# Patient Record
Sex: Male | Born: 1945 | Race: Black or African American | Hispanic: No | Marital: Married | Smoking: Former smoker
Health system: Southern US, Community
[De-identification: ages and names within clinical notes are randomized; demographics above are authoritative.]

## PROBLEM LIST (undated history)

## (undated) DIAGNOSIS — M1990 Unspecified osteoarthritis, unspecified site: Secondary | ICD-10-CM

## (undated) DIAGNOSIS — K648 Other hemorrhoids: Secondary | ICD-10-CM

## (undated) DIAGNOSIS — E7800 Pure hypercholesterolemia, unspecified: Secondary | ICD-10-CM

## (undated) DIAGNOSIS — I2510 Atherosclerotic heart disease of native coronary artery without angina pectoris: Secondary | ICD-10-CM

## (undated) DIAGNOSIS — R7303 Prediabetes: Secondary | ICD-10-CM

## (undated) DIAGNOSIS — T7840XA Allergy, unspecified, initial encounter: Secondary | ICD-10-CM

## (undated) DIAGNOSIS — I10 Essential (primary) hypertension: Secondary | ICD-10-CM

## (undated) HISTORY — PX: HERNIA REPAIR: SHX51

## (undated) HISTORY — PX: OTHER SURGICAL HISTORY: SHX169

## (undated) HISTORY — PX: POLYPECTOMY: SHX149

## (undated) HISTORY — DX: Unspecified osteoarthritis, unspecified site: M19.90

## (undated) HISTORY — DX: Other hemorrhoids: K64.8

## (undated) HISTORY — DX: Pure hypercholesterolemia, unspecified: E78.00

## (undated) HISTORY — DX: Atherosclerotic heart disease of native coronary artery without angina pectoris: I25.10

## (undated) HISTORY — DX: Essential (primary) hypertension: I10

## (undated) HISTORY — DX: Allergy, unspecified, initial encounter: T78.40XA

## (undated) HISTORY — DX: Prediabetes: R73.03

## (undated) HISTORY — PX: COLONOSCOPY: SHX174

---

## 2001-03-05 ENCOUNTER — Encounter (INDEPENDENT_AMBULATORY_CARE_PROVIDER_SITE_OTHER): Payer: Self-pay | Admitting: Specialist

## 2001-03-05 ENCOUNTER — Ambulatory Visit (HOSPITAL_COMMUNITY): Admission: RE | Admit: 2001-03-05 | Discharge: 2001-03-05 | Payer: Self-pay | Admitting: Internal Medicine

## 2004-07-22 ENCOUNTER — Ambulatory Visit (HOSPITAL_COMMUNITY): Admission: RE | Admit: 2004-07-22 | Discharge: 2004-07-22 | Payer: Self-pay | Admitting: Family Medicine

## 2007-02-12 ENCOUNTER — Ambulatory Visit: Payer: Self-pay | Admitting: Internal Medicine

## 2007-02-26 ENCOUNTER — Ambulatory Visit: Payer: Self-pay | Admitting: Internal Medicine

## 2007-04-14 ENCOUNTER — Encounter: Admission: RE | Admit: 2007-04-14 | Discharge: 2007-04-14 | Payer: Self-pay | Admitting: Orthopedic Surgery

## 2014-08-02 ENCOUNTER — Encounter: Payer: Self-pay | Admitting: *Deleted

## 2014-11-26 ENCOUNTER — Ambulatory Visit (INDEPENDENT_AMBULATORY_CARE_PROVIDER_SITE_OTHER): Payer: Medicare Other

## 2014-11-26 ENCOUNTER — Ambulatory Visit (INDEPENDENT_AMBULATORY_CARE_PROVIDER_SITE_OTHER): Payer: Medicare Other | Admitting: Family Medicine

## 2014-11-26 VITALS — BP 150/80 | HR 77 | Temp 98.1°F | Ht 72.0 in | Wt 201.0 lb

## 2014-11-26 DIAGNOSIS — M25551 Pain in right hip: Secondary | ICD-10-CM

## 2014-11-26 DIAGNOSIS — M1611 Unilateral primary osteoarthritis, right hip: Secondary | ICD-10-CM

## 2014-11-26 LAB — GLUCOSE, POCT (MANUAL RESULT ENTRY): POC Glucose: 120 mg/dl — AB (ref 70–99)

## 2014-11-26 MED ORDER — MELOXICAM 7.5 MG PO TABS: 7.5000 mg | ORAL_TABLET | Freq: Every day | ORAL | Status: DC

## 2014-11-26 MED ORDER — TRAMADOL HCL 50 MG PO TABS: 50.0000 mg | ORAL_TABLET | Freq: Four times a day (QID) | ORAL | Status: DC | PRN

## 2014-11-26 NOTE — Patient Instructions (Signed)
mobic once per day (Keep a record of your blood pressures outside of the office and if remaining over 150/90 - stop the meloxicam). tramadol if needed for breakthrough pain.  If not improving in next week to 10 days, let me know and I can refer you to an orthopaedist. Return to the clinic or go to the nearest emergency room if any of your symptoms worsen or new symptoms occur.  Hip Pain Your hip is the joint between your upper legs and your lower pelvis. The bones, cartilage, tendons, and muscles of your hip joint perform a lot of work each day supporting your body weight and allowing you to move around. Hip pain can range from a minor ache to severe pain in one or both of your hips. Pain may be felt on the inside of the hip joint near the groin, or the outside near the buttocks and upper thigh. You may have swelling or stiffness as well.  HOME CARE INSTRUCTIONS   Take medicines only as directed by your health care provider.  Apply ice to the injured area:  Put ice in a plastic bag.  Place a towel between your skin and the bag.  Leave the ice on for 15-20 minutes at a time, 3-4 times a day.  Keep your leg raised (elevated) when possible to lessen swelling.  Avoid activities that cause pain.  Follow specific exercises as directed by your health care provider.  Sleep with a pillow between your legs on your most comfortable side.  Record how often you have hip pain, the location of the pain, and what it feels like. SEEK MEDICAL CARE IF:   You are unable to put weight on your leg.  Your hip is red or swollen or very tender to touch.  Your pain or swelling continues or worsens after 1 week.  You have increasing difficulty walking.  You have a fever. SEEK IMMEDIATE MEDICAL CARE IF:   You have fallen.  You have a sudden increase in pain and swelling in your hip. MAKE SURE YOU:   Understand these instructions.  Will watch your condition.  Will get help right away if you are  not doing well or get worse. Document Released: 02/26/2010 Document Revised: 01/23/2014 Document Reviewed: 05/05/2013 [facility] Patient Information 2015 [place], [place]. This information is not intended to replace advice given to you by your health care provider. Make sure you discuss any questions you have with your health care provider.

## 2014-11-26 NOTE — Progress Notes (Addendum)
Subjective:    Patient ID: Chris Moore, male    DOB: May 19, 1946, 69 y.o.   MRN: [id number] This chart was scribed for Chris Agreste, MD by Martinique Peace, ED Scribe. The patient was seen in [facility]. The patient's care was started at 11:10 AM.  Hip Pain    HPI Comments: [name] is a 69 y.o. male who presents to the [facility] complaining of right hip pain onset 1 month ago that occurred while pt was walking and and began experiencing a sudden sharp pain. He explains that he plays golf frequently which could be responsible for current symptoms. He states he took a break from playing golf due to pain for 1 week to let hip rest. He reports pain had completely resolved after week of rest and started back to playing golf before aggravating hip again. Pt states pain is exacerbated with movement and intermittently radiates down into thigh and groin area. He further complains of new onset of weakness in right leg but denies any occurrences of leg giving out on him. He notes he has tried taking Advil and applying ice to affected area without relief. Pt denies any history of recent surgeries or falls. No complaints of rash, saddle numbness, or loss of urinary or bowel functions.     There are no active problems to display for this patient.   Past Medical History  Diagnosis Date  . Hypertension   . Hypercholesteremia   . Prediabetes   . Internal hemorrhoids   . Allergy   . Arthritis    Past Surgical History  Procedure Laterality Date  . Left knee arthroscopic surgery     No Known Allergies Prior to Admission medications   Medication Sig Start Date End Date Taking? Authorizing Provider  amLODipine (NORVASC) 5 MG tablet Take 5 mg by mouth daily.   Yes Historical Provider, MD  aspirin 81 MG tablet Take 81 mg by mouth daily.   Yes Historical Provider, MD  atorvastatin (LIPITOR) 40 MG tablet Take 40 mg by mouth daily.   Yes Historical Provider, MD  fluticasone (FLONASE) 50 MCG/ACT  nasal spray Place 1 spray into both nostrils daily.   Yes Historical Provider, MD   History   Social History  . Marital Status: Married    Spouse Name: N/A  . Number of Children: N/A  . Years of Education: N/A   Occupational History  . Not on file.   Social History Main Topics  . Smoking status: Never Smoker   . Smokeless tobacco: Not on file  . Alcohol Use: No  . Drug Use: No  . Sexual Activity: Not on file   Other Topics Concern  . Not on file   Social History Narrative      Review of Systems  Gastrointestinal: Negative for diarrhea and constipation.  Genitourinary: Negative for dysuria, urgency, frequency, decreased urine volume and difficulty urinating.  Musculoskeletal:       Right hip pain.   Skin: Negative for rash.  Neurological: Positive for weakness.       Objective:   Physical Exam  Constitutional: He is oriented to person, place, and time. He appears well-developed and well-nourished. No distress.  HENT:  Head: Normocephalic and atraumatic.  Eyes: Conjunctivae and EOM are normal.  Neck: Neck supple. No tracheal deviation present.  Cardiovascular: Normal rate.   Pulmonary/Chest: Effort normal. No respiratory distress.  Musculoskeletal: Normal range of motion.  L Spine- Full ROM. Able to heel and toe walk without  weakness. No sciatic tenderness.  R Hip- trochanteric bursa is non-tender. No palpable tenderness. Full flexion and full extension. Minimal discomfort with flexion of hip. Negative fadir, negative Corky Sox. Negative SLR.   Neurological: He is alert and oriented to person, place, and time. He displays no Babinski's sign on the right side. He displays no Babinski's sign on the left side.  Reflex Scores:      Patellar reflexes are 2+ on the right side and 2+ on the left side.      Achilles reflexes are 2+ on the right side and 2+ on the left side. Skin: Skin is warm and dry.  Psychiatric: He has a normal mood and affect. His behavior is normal.    Nursing note and vitals reviewed.    Filed Vitals:   11/26/14 1053  BP: 150/80  Pulse: 77  Temp: 98.1 F (36.7 C)  TempSrc: Oral  Height: 6' (1.829 m)  Weight: 201 lb (91.173 kg)  SpO2: 98%   [facility] reading (PRIMARY) by  Dr. Carlota Raspberry: R hip:  Degenerative changes, no apparent fx.   Results for orders placed or performed in visit on 11/26/14  POCT glucose (manual entry)  Result Value Ref Range   POC Glucose 120 (A) 70 - 99 mg/dl       Assessment & Plan:   [name] is a 69 y.o. male Right hip pain - Plan: DG HIP UNILAT WITH PELVIS 2-3 VIEWS RIGHT, POCT glucose (manual entry), meloxicam (MOBIC) 7.5 MG tablet, traMADol (ULTRAM) 50 MG tablet  Osteoarthritis of right hip, unspecified osteoarthritis type - Plan: meloxicam (MOBIC) 7.5 MG tablet, traMADol (ULTRAM) 50 MG tablet  Suspected OA of hip, but ddx includes sciatica. Start meloxicam (watch BP, sed and cardiac risks of this and NSAIDS discussed, so short term use ideally), tramadol if needed - SED. Recheck in next week to 10 days if not improving, sooner if worse.   Prediabetes - follow up with PCP. Not fasting this am. Not using prednisone yet, but consider glucose monitoring if this is used.   Meds ordered this encounter  Medications  . meloxicam (MOBIC) 7.5 MG tablet    Sig: Take 1 tablet (7.5 mg total) by mouth daily.    Dispense:  30 tablet    Refill:  0  . traMADol (ULTRAM) 50 MG tablet    Sig: Take 1 tablet (50 mg total) by mouth every 6 (six) hours as needed.    Dispense:  20 tablet    Refill:  0   Patient Instructions  mobic once per day (Keep a record of your blood pressures outside of the office and if remaining over 150/90 - stop the meloxicam). tramadol if needed for breakthrough pain.  If not improving in next week to 10 days, let me know and I can refer you to an orthopaedist. Return to the clinic or go to the nearest emergency room if any of your symptoms worsen or new symptoms occur.  Hip  Pain Your hip is the joint between your upper legs and your lower pelvis. The bones, cartilage, tendons, and muscles of your hip joint perform a lot of work each day supporting your body weight and allowing you to move around. Hip pain can range from a minor ache to severe pain in one or both of your hips. Pain may be felt on the inside of the hip joint near the groin, or the outside near the buttocks and upper thigh. You may have swelling or stiffness as  well.  HOME CARE INSTRUCTIONS   Take medicines only as directed by your health care provider.  Apply ice to the injured area:  Put ice in a plastic bag.  Place a towel between your skin and the bag.  Leave the ice on for 15-20 minutes at a time, 3-4 times a day.  Keep your leg raised (elevated) when possible to lessen swelling.  Avoid activities that cause pain.  Follow specific exercises as directed by your health care provider.  Sleep with a pillow between your legs on your most comfortable side.  Record how often you have hip pain, the location of the pain, and what it feels like. SEEK MEDICAL CARE IF:   You are unable to put weight on your leg.  Your hip is red or swollen or very tender to touch.  Your pain or swelling continues or worsens after 1 week.  You have increasing difficulty walking.  You have a fever. SEEK IMMEDIATE MEDICAL CARE IF:   You have fallen.  You have a sudden increase in pain and swelling in your hip. MAKE SURE YOU:   Understand these instructions.  Will watch your condition.  Will get help right away if you are not doing well or get worse. Document Released: 02/26/2010 Document Revised: 01/23/2014 Document Reviewed: 05/05/2013 [facility] Patient Information 2015 [place], [place]. This information is not intended to replace advice given to you by your health care provider. Make sure you discuss any questions you have with your health care provider.       I personally performed the  services described in this documentation, which was scribed in my presence. The recorded information has been reviewed and considered, and addended by me as needed.

## 2014-11-30 ENCOUNTER — Other Ambulatory Visit: Payer: Self-pay | Admitting: Family Medicine

## 2014-11-30 DIAGNOSIS — Z136 Encounter for screening for cardiovascular disorders: Secondary | ICD-10-CM

## 2014-12-26 ENCOUNTER — Ambulatory Visit
Admission: RE | Admit: 2014-12-26 | Discharge: 2014-12-26 | Disposition: A | Discharge status: Home / Self Care | Payer: Medicare Other | Source: Ambulatory Visit | Attending: Family Medicine | Admitting: Family Medicine

## 2014-12-26 DIAGNOSIS — Z136 Encounter for screening for cardiovascular disorders: Secondary | ICD-10-CM

## 2015-06-29 ENCOUNTER — Other Ambulatory Visit: Payer: Self-pay | Admitting: Physician Assistant

## 2015-06-29 DIAGNOSIS — M25551 Pain in right hip: Secondary | ICD-10-CM

## 2015-07-16 ENCOUNTER — Ambulatory Visit
Admission: RE | Admit: 2015-07-16 | Discharge: 2015-07-16 | Disposition: A | Discharge status: Home / Self Care | Payer: Medicare Other | Source: Ambulatory Visit | Attending: Physician Assistant | Admitting: Physician Assistant

## 2015-07-16 DIAGNOSIS — M25551 Pain in right hip: Secondary | ICD-10-CM

## 2016-04-10 ENCOUNTER — Ambulatory Visit (INDEPENDENT_AMBULATORY_CARE_PROVIDER_SITE_OTHER): Payer: Medicare Other | Admitting: Physician Assistant

## 2016-04-10 DIAGNOSIS — R7303 Prediabetes: Secondary | ICD-10-CM | POA: Insufficient documentation

## 2016-04-10 DIAGNOSIS — L03012 Cellulitis of left finger: Secondary | ICD-10-CM

## 2016-04-10 DIAGNOSIS — J309 Allergic rhinitis, unspecified: Secondary | ICD-10-CM | POA: Insufficient documentation

## 2016-04-10 DIAGNOSIS — E7800 Pure hypercholesterolemia, unspecified: Secondary | ICD-10-CM | POA: Insufficient documentation

## 2016-04-10 DIAGNOSIS — M1990 Unspecified osteoarthritis, unspecified site: Secondary | ICD-10-CM | POA: Insufficient documentation

## 2016-04-10 DIAGNOSIS — I10 Essential (primary) hypertension: Secondary | ICD-10-CM | POA: Insufficient documentation

## 2016-04-10 MED ORDER — DOXYCYCLINE HYCLATE 100 MG PO TABS: 100.0000 mg | ORAL_TABLET | Freq: Two times a day (BID) | ORAL | Status: DC

## 2016-04-10 NOTE — Patient Instructions (Addendum)
  Warm compresses Ok to use antibiotic ointment on the open area   IF you received an x-ray today, you will receive an invoice from [facility]. Please contact [facility] at [phone] with questions or concerns regarding your invoice.   IF you received labwork today, you will receive an invoice from [employer]. Please contact [facility] at [phone] with questions or concerns regarding your invoice.   Our billing staff will not be able to assist you with questions regarding bills from these companies.  You will be contacted with the lab results as soon as they are available. The fastest way to get your results is to activate your My Chart account. Instructions are located on the last page of this paperwork. If you have not heard from [place] regarding the results in 2 weeks, please contact this office.

## 2016-04-10 NOTE — Progress Notes (Signed)
   Chris Moore  MRN: [id number] DOB: 1945/10/01  Subjective:  Pt presents to clinic left great lateral ltoe pain for about a week - he thinks it is infected.  He has been getting out some pus. He has been keeping it clean with H2O2 but no warm compresses.  He has had ingrown toenails in the past and earlier this week he pulled out a piece of sharp nail on that side of his nail.  He otherwise has had no trauma to the area.  Review of Systems  Constitutional: Negative for fever and chills.  Skin: Positive for wound.    Patient Active Problem List   Diagnosis Date Noted  . Benign essential HTN 04/10/2016  . Hypercholesteremia 04/10/2016  . Prediabetes 04/10/2016  . Arthritis 04/10/2016  . Allergic rhinitis 04/10/2016    Current Outpatient Prescriptions on File Prior to Visit  Medication Sig Dispense Refill  . aspirin 81 MG tablet Take 81 mg by mouth daily.    Marland Kitchen atorvastatin (LIPITOR) 40 MG tablet Take 40 mg by mouth daily.    . fluticasone (FLONASE) 50 MCG/ACT nasal spray Place 1 spray into both nostrils daily.    . meloxicam (MOBIC) 7.5 MG tablet Take 1 tablet (7.5 mg total) by mouth daily. 30 tablet 0  . traMADol (ULTRAM) 50 MG tablet Take 1 tablet (50 mg total) by mouth every 6 (six) hours as needed. 20 tablet 0   No current facility-administered medications on file prior to visit.    No Known Allergies  Objective:  BP 124/80 mmHg  Pulse 83  Temp(Src) 98.1 F (36.7 C) (Oral)  Resp 18  Ht 6' (1.829 m)  Wt 197 lb (89.359 kg)  BMI 26.71 kg/m2  SpO2 99%  Physical Exam  Constitutional: He is oriented to person, place, and time and well-developed, well-nourished, and in no distress.  HENT:  Head: Normocephalic and atraumatic.  Right Ear: External ear normal.  Left Ear: External ear normal.  Eyes: Conjunctivae are normal.  Neck: Normal range of motion.  Pulmonary/Chest: Effort normal.  Neurological: He is alert and oriented to person, place, and time. Gait  normal.  Skin: Skin is warm and dry.  Left great toe - medial edge near the base of the nail fold is slightly erythematous with a small area of granulation tissue - the medial edge of the nail is completely visible  Psychiatric: Mood, memory, affect and judgment normal.   Procedure: Consent obtained - silver nitrate used on the granulation tissue - pt tolerated well Assessment and Plan :  Paronychia, left - Plan: doxycycline (VIBRA-TABS) 100 MG tablet   Pt to use warm compresses - I do not suspect an ingrown nail because the edge of the nail is visible - we will treat with abx as there is redness and some pain - he will f/u if there is any problems  Windell Hummingbird PA-C  [facility] 04/10/2016 9:12 AM

## 2016-10-07 ENCOUNTER — Other Ambulatory Visit (INDEPENDENT_AMBULATORY_CARE_PROVIDER_SITE_OTHER): Payer: Self-pay | Admitting: Orthopaedic Surgery

## 2016-12-18 ENCOUNTER — Other Ambulatory Visit (INDEPENDENT_AMBULATORY_CARE_PROVIDER_SITE_OTHER): Payer: Self-pay | Admitting: Orthopaedic Surgery

## 2016-12-29 ENCOUNTER — Emergency Department (HOSPITAL_COMMUNITY)
Admission: EM | Admit: 2016-12-29 | Discharge: 2016-12-29 | Disposition: A | Discharge status: Home / Self Care | Payer: Medicare Other | Attending: Emergency Medicine | Admitting: Emergency Medicine

## 2016-12-29 ENCOUNTER — Ambulatory Visit (INDEPENDENT_AMBULATORY_CARE_PROVIDER_SITE_OTHER): Payer: Medicare Other | Admitting: Family Medicine

## 2016-12-29 ENCOUNTER — Ambulatory Visit (INDEPENDENT_AMBULATORY_CARE_PROVIDER_SITE_OTHER): Payer: Medicare Other

## 2016-12-29 ENCOUNTER — Encounter (HOSPITAL_COMMUNITY): Payer: Self-pay

## 2016-12-29 VITALS — BP 148/83 | HR 104 | Temp 98.8°F | Resp 16 | Ht 72.0 in | Wt 196.0 lb

## 2016-12-29 DIAGNOSIS — R059 Cough, unspecified: Secondary | ICD-10-CM | POA: Insufficient documentation

## 2016-12-29 DIAGNOSIS — R791 Abnormal coagulation profile: Secondary | ICD-10-CM | POA: Diagnosis not present

## 2016-12-29 DIAGNOSIS — R05 Cough: Secondary | ICD-10-CM

## 2016-12-29 DIAGNOSIS — Z79899 Other long term (current) drug therapy: Secondary | ICD-10-CM | POA: Insufficient documentation

## 2016-12-29 DIAGNOSIS — I10 Essential (primary) hypertension: Secondary | ICD-10-CM | POA: Diagnosis not present

## 2016-12-29 DIAGNOSIS — J40 Bronchitis, not specified as acute or chronic: Secondary | ICD-10-CM | POA: Insufficient documentation

## 2016-12-29 DIAGNOSIS — Z7982 Long term (current) use of aspirin: Secondary | ICD-10-CM | POA: Diagnosis not present

## 2016-12-29 DIAGNOSIS — R000 Tachycardia, unspecified: Secondary | ICD-10-CM

## 2016-12-29 LAB — CBC
HCT: 41.2 % (ref 39.0–52.0)
Hemoglobin: 13.5 g/dL (ref 13.0–17.0)
MCH: 29 pg (ref 26.0–34.0)
MCHC: 32.8 g/dL (ref 30.0–36.0)
MCV: 88.6 fL (ref 78.0–100.0)
Platelets: 254 10*3/uL (ref 150–400)
RBC: 4.65 MIL/uL (ref 4.22–5.81)
RDW: 14 % (ref 11.5–15.5)
WBC: 15 10*3/uL — ABNORMAL HIGH (ref 4.0–10.5)

## 2016-12-29 LAB — POCT CBC
Granulocyte percent: 83 %G — AB (ref 37–80)
HCT, POC: 40 % — AB (ref 43.5–53.7)
Hemoglobin: 13.8 g/dL — AB (ref 14.1–18.1)
Lymph, poc: 2 (ref 0.6–3.4)
MCH, POC: 31 pg (ref 27–31.2)
MCHC: 34.5 g/dL (ref 31.8–35.4)
MCV: 89.8 fL (ref 80–97)
MID (cbc): 0.7 (ref 0–0.9)
MPV: 7.4 fL (ref 0–99.8)
POC Granulocyte: 13.6 — AB (ref 2–6.9)
POC LYMPH PERCENT: 12.5 %L (ref 10–50)
POC MID %: 4.5 %M (ref 0–12)
Platelet Count, POC: 239 10*3/uL (ref 142–424)
RBC: 4.45 M/uL — AB (ref 4.69–6.13)
RDW, POC: 14.5 %
WBC: 16.4 10*3/uL — AB (ref 4.6–10.2)

## 2016-12-29 LAB — BASIC METABOLIC PANEL
Anion gap: 9 (ref 5–15)
BUN: 14 mg/dL (ref 6–20)
CO2: 28 mmol/L (ref 22–32)
Calcium: 8.8 mg/dL — ABNORMAL LOW (ref 8.9–10.3)
Chloride: 102 mmol/L (ref 101–111)
Creatinine, Ser: 1.15 mg/dL (ref 0.61–1.24)
GFR calc Af Amer: >60 mL/min (ref >60)
GFR calc non Af Amer: >60 mL/min (ref >60)
Glucose, Bld: 120 mg/dL — ABNORMAL HIGH (ref 65–99)
Potassium: 3.9 mmol/L (ref 3.5–5.1)
Sodium: 139 mmol/L (ref 135–145)

## 2016-12-29 LAB — I-STAT TROPONIN, ED: Troponin i, poc: 0.01 ng/mL (ref 0.00–0.08)

## 2016-12-29 LAB — PROTIME-INR
INR: 1.03
Prothrombin Time: 13.6 seconds (ref 11.4–15.2)

## 2016-12-29 LAB — INFLUENZA PANEL BY PCR (TYPE A & B)
Influenza A By PCR: NEGATIVE
Influenza B By PCR: NEGATIVE

## 2016-12-29 MED ORDER — BENZONATATE 100 MG PO CAPS: 100.0000 mg | ORAL_CAPSULE | Freq: Every evening | ORAL | 0 refills | Status: AC | PRN

## 2016-12-29 MED ORDER — PREDNISONE 10 MG (21) PO TBPK: ORAL_TABLET | Freq: Every day | ORAL | 0 refills | Status: DC

## 2016-12-29 MED ORDER — FEXOFENADINE HCL 60 MG PO TABS: 60.0000 mg | ORAL_TABLET | Freq: Two times a day (BID) | ORAL | 0 refills | Status: DC

## 2016-12-29 MED ORDER — FLUTICASONE PROPIONATE 50 MCG/ACT NA SUSP: 2.0000 | Freq: Every day | NASAL | 0 refills | Status: AC

## 2016-12-29 MED ORDER — IPRATROPIUM-ALBUTEROL 0.5-2.5 (3) MG/3ML IN SOLN
3.0000 mL | Freq: Once | RESPIRATORY_TRACT | Status: AC
  Administered 2016-12-29: 3 mL via RESPIRATORY_TRACT
  Filled 2016-12-29: qty 3

## 2016-12-29 MED ORDER — GUAIFENESIN-DM 100-10 MG/5ML PO SYRP: 5.0000 mL | ORAL_SOLUTION | ORAL | 0 refills | Status: DC | PRN

## 2016-12-29 MED ORDER — ALBUTEROL SULFATE HFA 108 (90 BASE) MCG/ACT IN AERS
2.0000 | INHALATION_SPRAY | Freq: Once | RESPIRATORY_TRACT | Status: AC
  Administered 2016-12-29: 2 via RESPIRATORY_TRACT
  Filled 2016-12-29: qty 6.7

## 2016-12-29 NOTE — ED Notes (Signed)
Pt ambulated to bathroom 

## 2016-12-29 NOTE — Assessment & Plan Note (Signed)
Has some changes on CXR that is possible for pneumonia. He denies any history of COPD but was a Scientist, research (medical) for 30 years. Tachycardic and a new oxygen demand.  - EKG  - CXR  - transfer to ED via EMS

## 2016-12-29 NOTE — ED Notes (Signed)
Pt ambulated in hallway and maintained 96% O2 sats.

## 2016-12-29 NOTE — Patient Instructions (Signed)
     IF you received an x-ray today, you will receive an invoice from [facility]. Please contact [facility] at [phone] with questions or concerns regarding your invoice.   IF you received labwork today, you will receive an invoice from [facility]. Please contact [facility] at [phone] with questions or concerns regarding your invoice.   Our billing staff will not be able to assist you with questions regarding bills from these companies.  You will be contacted with the lab results as soon as they are available. The fastest way to get your results is to activate your My Chart account. Instructions are located on the last page of this paperwork. If you have not heard from us regarding the results in 2 weeks, please contact this office.     

## 2016-12-29 NOTE — ED Triage Notes (Signed)
PT RECEIVED FROM [facility] VIA EMS FOR A PRODUCTIVE COUGH X1 WEEK. PT DENIES FEVER OR CHEST PAIN, BUT HAD SINUS PRESSURE AND A RUNNY NOSE. PER EMS THE PT HAD A CXR AND O2 SAT WALKING WAS 83% PER THE [facility], SO THEY WANTED HIM TO BE EVALUATE TO R/O PNEUMONIA.

## 2016-12-29 NOTE — ED Provider Notes (Signed)
[facility] DEPT Provider Note   CSN: [id number] Arrival date & time: 12/29/16  1138     History   Chief Complaint Chief Complaint  Patient presents with  . Cough    HPI Chris Moore is a 71 y.o. male.  HPI   Pt with hx HTN, HLD, prediabetes p/w cough productive of brown sputum, SOB, rhinorrhea x 1 week.  Has been taking OTC medications and using steam without relief.  Sent from UC for hypoxia while ambulating (83%). He did get a flu shot this year, denies recent sick contacts.    Denies fevers, chest pain. Denies recent immobilization, leg swelling, family or personal history of blood clots.   Smoked briefly 30 years ago.  Did spray his yard with chemicals 2 weeks ago, wore a mask, does this every year.   Past Medical History:  Diagnosis Date  . Allergy   . Arthritis   . Hypercholesteremia   . Hypertension   . Internal hemorrhoids   . Prediabetes     Patient Active Problem List   Diagnosis Date Noted  . Benign essential HTN 04/10/2016  . Hypercholesteremia 04/10/2016  . Prediabetes 04/10/2016  . Arthritis 04/10/2016  . Allergic rhinitis 04/10/2016    Past Surgical History:  Procedure Laterality Date  . left knee arthroscopic surgery         Home Medications    Prior to Admission medications   Medication Sig Start Date End Date Taking? Authorizing Provider  acetaminophen (TYLENOL) 500 MG tablet Take 1,000 mg by mouth every 6 (six) hours as needed for mild pain, moderate pain, fever or headache.   Yes Historical Provider, MD  aspirin EC 81 MG tablet Take 81 mg by mouth daily.   Yes Historical Provider, MD  atorvastatin (LIPITOR) 40 MG tablet Take 40 mg by mouth at bedtime.    Yes Historical Provider, MD  ibuprofen (ADVIL,MOTRIN) 800 MG tablet Take 800 mg by mouth every 8 (eight) hours as needed for mild pain or moderate pain.   Yes Historical Provider, MD  losartan-hydrochlorothiazide (HYZAAR) 50-12.5 MG tablet Take 1 tablet by mouth daily.   Yes  Historical Provider, MD  Phenylephrine-APAP-Guaifenesin (MUCINEX FAST-MAX) 10-650-400 MG/20ML LIQD Take 20 mLs by mouth every 4 (four) hours as needed (for cough/congestion).   Yes Historical Provider, MD  benzonatate (TESSALON) 100 MG capsule Take 1 capsule (100 mg total) by mouth at bedtime as needed for cough. 12/29/16   Clayton Bibles, PA-C  fexofenadine (ALLEGRA) 60 MG tablet Take 1 tablet (60 mg total) by mouth 2 (two) times daily. 12/29/16   Clayton Bibles, PA-C  fluticasone (FLONASE) 50 MCG/ACT nasal spray Place 2 sprays into both nostrils daily. 12/29/16   Clayton Bibles, PA-C  guaiFENesin-dextromethorphan (ROBITUSSIN DM) 100-10 MG/5ML syrup Take 5 mLs by mouth every 4 (four) hours as needed for cough (and congestion). 12/29/16   Clayton Bibles, PA-C  predniSONE (STERAPRED UNI-PAK 21 TAB) 10 MG (21) TBPK tablet Take by mouth daily. Day 1: take 6 tabs.  Day 2: 5 tabs  Day 3: 4 tabs  Day 4: 3 tabs  Day 5: 2 tabs  Day 6: 1 tab 12/29/16   Clayton Bibles, PA-C    Family History Family History  Problem Relation Age of Onset  . Hypertension Mother   . Diabetes Mother   . Hyperlipidemia Mother   . Hypertension Father     Social History Social History  Substance Use Topics  . Smoking status: Never Smoker  . Smokeless tobacco: Never  Used  . Alcohol use No     Allergies   Patient has no known allergies.   Review of Systems Review of Systems   Physical Exam Updated Vital Signs BP (!) 153/87 (BP Location: Right Arm)   Pulse (!) 111   Temp 99 F (37.2 C) (Oral)   Resp 12   Ht 6' (1.829 m)   Wt 88.9 kg   SpO2 92%   BMI 26.58 kg/m   Physical Exam  Constitutional: He appears well-developed and well-nourished. No distress.  HENT:  Head: Normocephalic and atraumatic.  Neck: Neck supple.  Cardiovascular: Normal rate and regular rhythm.   Pulmonary/Chest: Effort normal and breath sounds normal. No respiratory distress. He has no wheezes. He has no rales.  Coughing with deep inspiration  Abdominal:  Soft. He exhibits no distension and no mass. There is no tenderness. There is no rebound and no guarding.  Musculoskeletal: He exhibits no edema.  Neurological: He is alert. He exhibits normal muscle tone.  Skin: He is not diaphoretic.  Nursing note and vitals reviewed.    ED Treatments / Results  Labs (all labs ordered are listed, but only abnormal results are displayed) Labs Reviewed  BASIC METABOLIC PANEL - Abnormal; Notable for the following:       Result Value   Glucose, Bld 120 (*)    Calcium 8.8 (*)    All other components within normal limits  CBC - Abnormal; Notable for the following:    WBC 15.0 (*)    All other components within normal limits  PROTIME-INR  INFLUENZA PANEL BY PCR (TYPE A & B)  I-STAT TROPOININ, ED    EKG  EKG Interpretation None       Radiology Dg Chest 2 View  Result Date: 12/29/2016 CLINICAL DATA:  Shortness of breath EXAM: CHEST  2 VIEW COMPARISON:  None in PACs FINDINGS: The lungs are hyperinflated. The interstitial markings are increased diffusely. The heart and pulmonary vascularity are normal. There is calcification in the wall of the aortic arch. The bony thorax exhibits no acute abnormality. 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 Diffuse mild interstitial prominence may be due to acute pneumonitis or may be reflective of a chronic fibrotic process. No alveolar pneumonia nor pulmonary edema. Thoracic aortic atherosclerosis. Electronically Signed   By: David  Martinique M.D.   On: 12/29/2016 10:03    Procedures Procedures (including critical care time)  Medications Ordered in ED Medications  ipratropium-albuterol (DUONEB) 0.5-2.5 (3) MG/3ML nebulizer solution 3 mL (3 mLs Nebulization Given 12/29/16 1333)  albuterol (PROVENTIL HFA;VENTOLIN HFA) 108 (90 Base) MCG/ACT inhaler 2 puff (2 puffs Inhalation Given 12/29/16 1449)  ipratropium-albuterol (DUONEB) 0.5-2.5 (3) MG/3ML nebulizer solution 3 mL (3 mLs Nebulization Given 12/29/16 1446)       Initial Impression / Assessment and Plan / ED Course  I have reviewed the triage vital signs and the nursing notes.  Pertinent labs & imaging results that were available during my care of the patient were reviewed by me and considered in my medical decision making (see chart for details).  Clinical Course as of Dec 29 1837  Mon Dec 29, 2016  1344 Pt ambulated around the department and maintained O2 sat 96%  [EW]  1512 Pt feeling better after breathing treatments.  Lungs CTAB.    [EW]    Clinical Course User Index [EW] Clayton Bibles, PA-C    Afebrile, nontoxic patient with cough productive of brown sputum, SOB (worse at night when he has a lot  of drainage) x 1 week.  Has postnasal drip and allergies.  Sent from urgent care for hypoxia.  He has had no hypoxia here, no hypoxia with ambulation.  He has no risk factors for PE. Doubt ACS, dissection.  CXR from urgent care reviewed. Labs here significant for leukocytosis, mild hyperglycemia. Pt also seen and examined by Dr Leonette Monarch, discussed workup and plan with him.  Pt feeling better after neb treatments.   D/C home with treatment for bronchitis, discussed return precautions.  PCP follow up.  Discussed result, findings, treatment, and follow up  with patient.  Pt given return precautions.  Pt verbalizes understanding and agrees with plan.       Final Clinical Impressions(s) / ED Diagnoses   Final diagnoses:  Bronchitis    New Prescriptions Discharge Medication List as of 12/29/2016  3:26 PM    START taking these medications   Details  benzonatate (TESSALON) 100 MG capsule Take 1 capsule (100 mg total) by mouth at bedtime as needed for cough., Starting Mon 12/29/2016, Print    fexofenadine (ALLEGRA) 60 MG tablet Take 1 tablet (60 mg total) by mouth 2 (two) times daily., Starting Mon 12/29/2016, Print    fluticasone (FLONASE) 50 MCG/ACT nasal spray Place 2 sprays into both nostrils daily., Starting Mon 12/29/2016, Print     guaiFENesin-dextromethorphan (ROBITUSSIN DM) 100-10 MG/5ML syrup Take 5 mLs by mouth every 4 (four) hours as needed for cough (and congestion)., Starting Mon 12/29/2016, Print    predniSONE (STERAPRED UNI-PAK 21 TAB) 10 MG (21) TBPK tablet Take by mouth daily. Day 1: take 6 tabs.  Day 2: 5 tabs  Day 3: 4 tabs  Day 4: 3 tabs  Day 5: 2 tabs  Day 6: 1 tab, Starting Mon 12/29/2016, Print         [place], PA-C 12/29/16 [place], MD 12/30/16 2149

## 2016-12-29 NOTE — Progress Notes (Signed)
  Chris Moore - 71 y.o. male MRN [id number]  Date of birth: 11-28-1945  SUBJECTIVE:  Including CC & ROS.  Chief Complaint  Patient presents with  . Sinusitis  . Nasal Congestion   Chris Moore is a 71 yo M that is presenting with cough. His symptoms started about a week ago. He has some sinus problems and headaches. He's been coughing and this kept him up all night. He was having night sweats but denies fevers. He has taken over the counter medications. Has not been started any new medications. He reports no chest pain, diaphoresis, nausea or vomiting.   ROS: No unexpected weight loss, fever, chills, swelling, instability, muscle pain, numbness/tingling, redness, otherwise see HPI    HISTORY: Past Medical, Surgical, Social, and Family History Reviewed & Updated per EMR.   Pertinent Historical Findings include: PMSHx -  HTN, prediabetes  PSHx -  No tobacco or alcohol use.  FHx -  HTN, DM, HLD   DATA REVIEWED: 12/29/16: chest x-ray: Mild hyperinflation likely reflects COPD. Diffuse mild interstitial prominence may be due to acute pneumonitis or may be reflective of a chronic fibrotic process. No alveolar pneumonia nor pulmonary edema.  Thoracic aortic atherosclerosis.  PHYSICAL EXAM:  VS: BP:(!) 148/83  HR:(!) 104bpm  TEMP:98.8 F (37.1 C)(Oral)  RESP:(!) 83 %  HT:6' (182.9 cm)   WT:196 lb (88.9 kg)  BMI:26.6 PHYSICAL EXAM: Gen: NAD, alert, cooperative with exam,  HEENT: NCAT, EOMI, clear conjunctiva, oropharynx clear, supple neck, no cervical LAD, TM's clear and intact, turbinates normal,  CV: regular rhythm, tachycardic, good S1/S2, no murmur, no edema, capillary refill brisk  Resp: CTABL, no wheezes, non-labored Abd: SNTND, BS present, no guarding or organomegaly Skin: no rashes, normal turgor  Neuro: no gross deficits.  Psych: alert and oriented   ASSESSMENT & PLAN:   Cough Has some changes on CXR that is possible for pneumonia. He denies any history of COPD but  was a Scientist, research (medical) for 30 years. Tachycardic and a new oxygen demand.  - EKG  - CXR  - transfer to ED via EMS

## 2016-12-29 NOTE — Discharge Instructions (Signed)
Read the information below.  Use the prescribed medication as directed.  Please discuss all new medications with your pharmacist.  You may return to the Emergency Department at any time for worsening condition or any new symptoms that concern you.   If you develop worsening shortness of breath, uncontrolled wheezing, severe chest pain, or fevers despite using tylenol and/or ibuprofen, return for a recheck.     °

## 2016-12-30 NOTE — Progress Notes (Signed)
Patient discussed and examined with Dr. Raeford Razor. Hypoxic on ambulation. Agree with findings, assessment and plan of care per his note, and need for EMS transport for ER eval. Possible interstitial lung disease vs copd with acute infection.

## 2017-01-05 ENCOUNTER — Encounter: Payer: Self-pay | Admitting: Internal Medicine

## 2017-04-22 ENCOUNTER — Encounter: Payer: Self-pay | Admitting: Internal Medicine

## 2017-04-28 ENCOUNTER — Ambulatory Visit (AMBULATORY_SURGERY_CENTER): Payer: Self-pay | Admitting: *Deleted

## 2017-04-28 VITALS — Ht 72.0 in | Wt 198.0 lb

## 2017-04-28 DIAGNOSIS — Z86010 Personal history of colonic polyps: Secondary | ICD-10-CM

## 2017-04-28 MED ORDER — NA SULFATE-K SULFATE-MG SULF 17.5-3.13-1.6 GM/177ML PO SOLN: 1.0000 [IU] | Freq: Once | ORAL | 0 refills | Status: AC

## 2017-04-28 NOTE — Progress Notes (Signed)
No egg or soy allergy known to patient  No issues with past sedation with any surgeries  or procedures, no intubation problems  No diet pills per patient No home 02 use per patient  No blood thinners per patient  Pt has had issues with constipation  5 months ago takes laxative and then regular none at this time No A fib or A flutter  EMMI video sent to pt's e mail

## 2017-04-29 ENCOUNTER — Encounter: Payer: Self-pay | Admitting: Internal Medicine

## 2017-05-12 ENCOUNTER — Encounter: Payer: Self-pay | Admitting: Internal Medicine

## 2017-05-12 ENCOUNTER — Ambulatory Visit (AMBULATORY_SURGERY_CENTER): Payer: Medicare Other | Admitting: Internal Medicine

## 2017-05-12 VITALS — BP 135/81 | HR 72 | Temp 98.9°F | Resp 16 | Ht 72.0 in | Wt 198.0 lb

## 2017-05-12 DIAGNOSIS — Z1211 Encounter for screening for malignant neoplasm of colon: Secondary | ICD-10-CM

## 2017-05-12 DIAGNOSIS — Z1212 Encounter for screening for malignant neoplasm of rectum: Secondary | ICD-10-CM | POA: Diagnosis not present

## 2017-05-12 DIAGNOSIS — D122 Benign neoplasm of ascending colon: Secondary | ICD-10-CM

## 2017-05-12 MED ORDER — SODIUM CHLORIDE 0.9 % IV SOLN: 500.0000 mL | INTRAVENOUS | Status: AC

## 2017-05-12 NOTE — Op Note (Signed)
[facility] Patient Name: Chris Moore Procedure Date: 05/12/2017 12:04 PM MRN: [id number] Endoscopist: Docia Chuck. Henrene Pastor , MD Age: 71 Referring MD:  Date of Birth: 1946-03-04 Gender: Male Account #: [id number] Procedure:                Colonoscopy, with cold snare polypectomy x 1 Indications:              Screening for colorectal malignant neoplasm,                            previous examinations 2002 and 2008. Medicines:                Monitored Anesthesia Care Procedure:                Pre-Anesthesia Assessment:                           - Prior to the procedure, a History and Physical                            was performed, and patient medications and                            allergies were reviewed. The patient's tolerance of                            previous anesthesia was also reviewed. The risks                            and benefits of the procedure and the sedation                            options and risks were discussed with the patient.                            All questions were answered, and informed consent                            was obtained. Prior Anticoagulants: The patient has                            taken no previous anticoagulant or antiplatelet                            agents. ASA Grade Assessment: II - A patient with                            mild systemic disease. After reviewing the risks                            and benefits, the patient was deemed in                            satisfactory condition to undergo the procedure.  After obtaining informed consent, the colonoscope                            was passed under direct vision. Throughout the                            procedure, the patient's blood pressure, pulse, and                            oxygen saturations were monitored continuously. The                            Colonoscope was introduced through the anus and     advanced to the the cecum, identified by                            appendiceal orifice and ileocecal valve. The                            ileocecal valve, appendiceal orifice, and rectum                            were photographed. The quality of the bowel                            preparation was good. The colonoscopy was performed                            without difficulty. The patient tolerated the                            procedure well. The bowel preparation used was                            SUPREP. Scope In: 12:10:02 PM Scope Out: 12:24:52 PM Scope Withdrawal Time: 0 hours 10 minutes 24 seconds  Total Procedure Duration: 0 hours 14 minutes 50 seconds  Findings:                 A 2 mm polyp was found in the ascending colon. The                            polyp was removed with a cold snare. Resection and                            retrieval were complete.                           Multiple small and large-mouthed diverticula were                            found in the entire colon.                           Internal hemorrhoids were found during retroflexion.  The exam was otherwise without abnormality on                            direct and retroflexion views. Complications:            No immediate complications. Estimated blood loss:                            None. Estimated Blood Loss:     Estimated blood loss: none. Impression:               - One 2 mm polyp in the ascending colon, removed                            with a cold snare. Resected and retrieved.                           - Diverticulosis in the entire examined colon.                           - Internal hemorrhoids.                           - The examination was otherwise normal on direct                            and retroflexion views. Recommendation:           - Repeat colonoscopy in 5 years if polyp                            adenomatous. Otherwise no routine  surveillance                            recommended.                           - Patient has a contact number available for                            emergencies. The signs and symptoms of potential                            delayed complications were discussed with the                            patient. Return to normal activities tomorrow.                            Written discharge instructions were provided to the                            patient.                           - Resume previous diet.                           -  Continue present medications.                           - Await pathology results. Docia Chuck. Henrene Pastor, MD 05/12/2017 12:29:12 PM This report has been signed electronically.

## 2017-05-12 NOTE — Progress Notes (Signed)
To PACU VSS. Report to [email or web address] 

## 2017-05-12 NOTE — Patient Instructions (Signed)
   Information on polyps ,diverticulosis,hemorrhoids ,and high fiber diet given to you today  Await pathology results in a letter from Dr Henrene Pastor.   YOU HAD AN ENDOSCOPIC PROCEDURE TODAY AT [facility]:   Refer to the procedure report that was given to you for any specific questions about what was found during the examination.  If the procedure report does not answer your questions, please call your gastroenterologist to clarify.  If you requested that your care partner not be given the details of your procedure findings, then the procedure report has been included in a sealed envelope for you to review at your convenience later.  YOU SHOULD EXPECT: Some feelings of bloating in the abdomen. Passage of more gas than usual.  Walking can help get rid of the air that was put into your GI tract during the procedure and reduce the bloating. If you had a lower endoscopy (such as a colonoscopy or flexible sigmoidoscopy) you may notice spotting of blood in your stool or on the toilet paper. If you underwent a bowel prep for your procedure, you may not have a normal bowel movement for a few days.  Please Note:  You might notice some irritation and congestion in your nose or some drainage.  This is from the oxygen used during your procedure.  There is no need for concern and it should clear up in a day or so.  SYMPTOMS TO REPORT IMMEDIATELY:   Following lower endoscopy (colonoscopy or flexible sigmoidoscopy):  Excessive amounts of blood in the stool  Significant tenderness or worsening of abdominal pains  Swelling of the abdomen that is new, acute  Fever of 100F or higher    For urgent or emergent issues, a gastroenterologist can be reached at any hour by calling [phone].   DIET:  We do recommend a small meal at first, but then you may proceed to your regular diet.  Drink plenty of fluids but you should avoid alcoholic beverages for 24 hours.  ACTIVITY:  You should plan to  take it easy for the rest of today and you should NOT DRIVE or use heavy machinery until tomorrow (because of the sedation medicines used during the test).    FOLLOW UP: Our staff will call the number listed on your records the next business day following your procedure to check on you and address any questions or concerns that you may have regarding the information given to you following your procedure. If we do not reach you, we will leave a message.  However, if you are feeling well and you are not experiencing any problems, there is no need to return our call.  We will assume that you have returned to your regular daily activities without incident.  If any biopsies were taken you will be contacted by phone or by letter within the next 1-3 weeks.  Please call us at [phone] if you have not heard about the biopsies in 3 weeks.    SIGNATURES/CONFIDENTIALITY: You and/or your care partner have signed paperwork which will be entered into your electronic medical record.  These signatures attest to the fact that that the information above on your After Visit Summary has been reviewed and is understood.  Full responsibility of the confidentiality of this discharge information lies with you and/or your care-partner.

## 2017-05-12 NOTE — Progress Notes (Signed)
Called to room to assist during endoscopic procedure.  Patient ID and intended procedure confirmed with present staff. Received instructions for my participation in the procedure from the performing physician.  

## 2017-05-13 ENCOUNTER — Telehealth: Payer: Self-pay

## 2017-05-13 NOTE — Telephone Encounter (Signed)
  Follow up Call-  Call back number 05/12/2017  Post procedure Call Back phone  # [phone]  Permission to leave phone message Yes  Some recent data might be hidden     Patient questions:  Do you have a fever, pain , or abdominal swelling? No. Pain Score  0 *  Have you tolerated food without any problems? Yes.    Have you been able to return to your normal activities? Yes.    Do you have any questions about your discharge instructions: Diet   No. Medications  No. Follow up visit  No.  Do you have questions or concerns about your Care? No.  Actions: * If pain score is 4 or above: No action needed, pain <4.

## 2017-05-18 ENCOUNTER — Encounter: Payer: Self-pay | Admitting: Internal Medicine

## 2017-06-02 ENCOUNTER — Other Ambulatory Visit (INDEPENDENT_AMBULATORY_CARE_PROVIDER_SITE_OTHER): Payer: Self-pay | Admitting: Orthopaedic Surgery

## 2019-10-13 ENCOUNTER — Ambulatory Visit: Payer: Medicare Other | Attending: Internal Medicine

## 2019-10-13 DIAGNOSIS — Z23 Encounter for immunization: Secondary | ICD-10-CM

## 2019-10-13 NOTE — Progress Notes (Signed)
   Covid-19 Vaccination Clinic  Name:  Chris Moore    MRN: [id number] DOB: [birth year]  10/13/2019  Chris Moore was observed post Covid-19 immunization for 15 minutes without incidence. He was provided with Vaccine Information Sheet and instruction to access the V-Safe system.   Chris Moore was instructed to call 911 with any severe reactions post vaccine: Marland Kitchen Difficulty breathing  . Swelling of your face and throat  . A fast heartbeat  . A bad rash all over your body  . Dizziness and weakness    Immunizations Administered    Name Date Dose VIS Date Route   Pfizer COVID-19 Vaccine 10/13/2019  5:14 PM 0.3 mL 09/02/2019 Intramuscular   Manufacturer: [place]   Lot: [id number]   [place]: [id number]

## 2019-10-23 ENCOUNTER — Ambulatory Visit: Payer: Medicare Other

## 2019-11-03 ENCOUNTER — Ambulatory Visit: Payer: Medicare Other

## 2019-11-03 ENCOUNTER — Ambulatory Visit: Payer: Medicare Other | Attending: Internal Medicine

## 2019-11-03 DIAGNOSIS — Z23 Encounter for immunization: Secondary | ICD-10-CM | POA: Insufficient documentation

## 2019-11-03 NOTE — Progress Notes (Signed)
   Covid-19 Vaccination Clinic  Name:  Chris Moore    MRN: [id number] DOB: [birth year]  11/03/2019  [name] was observed post Covid-19 immunization for 15 minutes without incidence. He was provided with Vaccine Information Sheet and instruction to access the V-Safe system.   [name] was instructed to call 911 with any severe reactions post vaccine: Marland Kitchen Difficulty breathing  . Swelling of your face and throat  . A fast heartbeat  . A bad rash all over your body  . Dizziness and weakness    Immunizations Administered    Name Date Dose VIS Date Route   Pfizer COVID-19 Vaccine 11/03/2019  8:52 AM 0.3 mL 09/02/2019 Intramuscular   Manufacturer: [place]   Lot: [id number]   [place]: [id number]

## 2020-10-01 DIAGNOSIS — E782 Mixed hyperlipidemia: Secondary | ICD-10-CM | POA: Diagnosis not present

## 2020-10-01 DIAGNOSIS — D696 Thrombocytopenia, unspecified: Secondary | ICD-10-CM | POA: Diagnosis not present

## 2020-10-01 DIAGNOSIS — Z0000 Encounter for general adult medical examination without abnormal findings: Secondary | ICD-10-CM | POA: Diagnosis not present

## 2020-10-01 DIAGNOSIS — Z1389 Encounter for screening for other disorder: Secondary | ICD-10-CM | POA: Diagnosis not present

## 2020-10-01 DIAGNOSIS — J309 Allergic rhinitis, unspecified: Secondary | ICD-10-CM | POA: Diagnosis not present

## 2020-10-01 DIAGNOSIS — J449 Chronic obstructive pulmonary disease, unspecified: Secondary | ICD-10-CM | POA: Diagnosis not present

## 2020-10-01 DIAGNOSIS — Z1211 Encounter for screening for malignant neoplasm of colon: Secondary | ICD-10-CM | POA: Diagnosis not present

## 2020-10-01 DIAGNOSIS — E1169 Type 2 diabetes mellitus with other specified complication: Secondary | ICD-10-CM | POA: Diagnosis not present

## 2020-10-01 DIAGNOSIS — I10 Essential (primary) hypertension: Secondary | ICD-10-CM | POA: Diagnosis not present

## 2020-10-17 DIAGNOSIS — E1169 Type 2 diabetes mellitus with other specified complication: Secondary | ICD-10-CM | POA: Diagnosis not present

## 2020-10-17 DIAGNOSIS — J449 Chronic obstructive pulmonary disease, unspecified: Secondary | ICD-10-CM | POA: Diagnosis not present

## 2020-10-17 DIAGNOSIS — J4521 Mild intermittent asthma with (acute) exacerbation: Secondary | ICD-10-CM | POA: Diagnosis not present

## 2020-10-17 DIAGNOSIS — E782 Mixed hyperlipidemia: Secondary | ICD-10-CM | POA: Diagnosis not present

## 2020-10-17 DIAGNOSIS — I10 Essential (primary) hypertension: Secondary | ICD-10-CM | POA: Diagnosis not present

## 2020-10-17 DIAGNOSIS — E119 Type 2 diabetes mellitus without complications: Secondary | ICD-10-CM | POA: Diagnosis not present

## 2020-10-25 DIAGNOSIS — H35033 Hypertensive retinopathy, bilateral: Secondary | ICD-10-CM | POA: Diagnosis not present

## 2020-10-25 DIAGNOSIS — I10 Essential (primary) hypertension: Secondary | ICD-10-CM | POA: Diagnosis not present

## 2020-10-25 DIAGNOSIS — H524 Presbyopia: Secondary | ICD-10-CM | POA: Diagnosis not present

## 2020-10-25 DIAGNOSIS — H5203 Hypermetropia, bilateral: Secondary | ICD-10-CM | POA: Diagnosis not present

## 2020-10-25 DIAGNOSIS — H2513 Age-related nuclear cataract, bilateral: Secondary | ICD-10-CM | POA: Diagnosis not present

## 2020-10-30 DIAGNOSIS — J449 Chronic obstructive pulmonary disease, unspecified: Secondary | ICD-10-CM | POA: Diagnosis not present

## 2020-10-30 DIAGNOSIS — J4521 Mild intermittent asthma with (acute) exacerbation: Secondary | ICD-10-CM | POA: Diagnosis not present

## 2020-10-30 DIAGNOSIS — E1169 Type 2 diabetes mellitus with other specified complication: Secondary | ICD-10-CM | POA: Diagnosis not present

## 2020-10-30 DIAGNOSIS — I10 Essential (primary) hypertension: Secondary | ICD-10-CM | POA: Diagnosis not present

## 2020-10-30 DIAGNOSIS — E782 Mixed hyperlipidemia: Secondary | ICD-10-CM | POA: Diagnosis not present

## 2020-10-30 DIAGNOSIS — E119 Type 2 diabetes mellitus without complications: Secondary | ICD-10-CM | POA: Diagnosis not present

## 2020-12-17 DIAGNOSIS — J449 Chronic obstructive pulmonary disease, unspecified: Secondary | ICD-10-CM | POA: Diagnosis not present

## 2020-12-17 DIAGNOSIS — E1169 Type 2 diabetes mellitus with other specified complication: Secondary | ICD-10-CM | POA: Diagnosis not present

## 2020-12-17 DIAGNOSIS — I10 Essential (primary) hypertension: Secondary | ICD-10-CM | POA: Diagnosis not present

## 2020-12-17 DIAGNOSIS — E782 Mixed hyperlipidemia: Secondary | ICD-10-CM | POA: Diagnosis not present

## 2020-12-17 DIAGNOSIS — E119 Type 2 diabetes mellitus without complications: Secondary | ICD-10-CM | POA: Diagnosis not present

## 2020-12-17 DIAGNOSIS — J4521 Mild intermittent asthma with (acute) exacerbation: Secondary | ICD-10-CM | POA: Diagnosis not present

## 2021-01-24 DIAGNOSIS — E119 Type 2 diabetes mellitus without complications: Secondary | ICD-10-CM | POA: Diagnosis not present

## 2021-01-24 DIAGNOSIS — E782 Mixed hyperlipidemia: Secondary | ICD-10-CM | POA: Diagnosis not present

## 2021-01-24 DIAGNOSIS — I10 Essential (primary) hypertension: Secondary | ICD-10-CM | POA: Diagnosis not present

## 2021-01-24 DIAGNOSIS — J4521 Mild intermittent asthma with (acute) exacerbation: Secondary | ICD-10-CM | POA: Diagnosis not present

## 2021-01-24 DIAGNOSIS — E1169 Type 2 diabetes mellitus with other specified complication: Secondary | ICD-10-CM | POA: Diagnosis not present

## 2021-01-24 DIAGNOSIS — J449 Chronic obstructive pulmonary disease, unspecified: Secondary | ICD-10-CM | POA: Diagnosis not present

## 2021-04-18 DIAGNOSIS — I10 Essential (primary) hypertension: Secondary | ICD-10-CM | POA: Diagnosis not present

## 2021-04-18 DIAGNOSIS — E782 Mixed hyperlipidemia: Secondary | ICD-10-CM | POA: Diagnosis not present

## 2021-04-18 DIAGNOSIS — R002 Palpitations: Secondary | ICD-10-CM | POA: Diagnosis not present

## 2021-04-18 DIAGNOSIS — J309 Allergic rhinitis, unspecified: Secondary | ICD-10-CM | POA: Diagnosis not present

## 2021-04-18 DIAGNOSIS — E1169 Type 2 diabetes mellitus with other specified complication: Secondary | ICD-10-CM | POA: Diagnosis not present

## 2021-04-18 DIAGNOSIS — J449 Chronic obstructive pulmonary disease, unspecified: Secondary | ICD-10-CM | POA: Diagnosis not present

## 2021-07-02 NOTE — Progress Notes (Signed)
Cardiology Office Note   Date:  07/03/2021   ID:  Chris, Moore 11/18/1945, MRN [id number]  PCP:  Antony Contras, MD    No chief complaint on file.  palpitations  Wt Readings from Last 3 Encounters:  07/03/21 203 lb 12.8 oz (92.4 kg)  05/12/17 198 lb (89.8 kg)  04/28/17 198 lb (89.8 kg)       History of Present Illness: Chris Moore is a 75 y.o. male who is being seen today for the evaluation of palpitations at the request of Antony Contras, MD.  Quit smoking in 1980.   Can feel a skipped beat for a second, typically when checking BP.  Nothing prolonged.    Denies : Chest pain. Leg edema. Nitroglycerin use. Orthopnea. Palpitations. Paroxysmal nocturnal dyspnea. Shortness of breath. Syncope.    Dizziness when working in the heat only.   Brother had CABG at age 93.  Nonsmoker.   Exercises daily with stationary bike, golf, bands, some UE weights.  Walks in the yard.  Mild DOE.    Past Medical History:  Diagnosis Date   Allergy    Arthritis    Hypercholesteremia    Hypertension    Internal hemorrhoids    Prediabetes     Past Surgical History:  Procedure Laterality Date   COLONOSCOPY     left knee arthroscopic surgery     POLYPECTOMY       Current Outpatient Medications  Medication Sig Dispense Refill   acetaminophen (TYLENOL) 500 MG tablet Take 1,000 mg by mouth every 6 (six) hours as needed for mild pain, moderate pain, fever or headache.     aspirin EC 81 MG tablet Take 81 mg by mouth daily.     atorvastatin (LIPITOR) 40 MG tablet Take 40 mg by mouth at bedtime.      benzonatate (TESSALON) 100 MG capsule Take 1 capsule (100 mg total) by mouth at bedtime as needed for cough. 15 capsule 0   diphenhydrAMINE (BENADRYL) 25 MG tablet Take 25 mg by mouth at bedtime as needed.     fexofenadine (ALLEGRA) 60 MG tablet Take 1 tablet (60 mg total) by mouth 2 (two) times daily. 60 tablet 0   fluticasone (FLONASE) 50 MCG/ACT nasal spray Place 2 sprays  into both nostrils daily. 16 g 0   guaiFENesin-dextromethorphan (ROBITUSSIN DM) 100-10 MG/5ML syrup Take 5 mLs by mouth every 4 (four) hours as needed for cough (and congestion). 118 mL 0   IBU 800 MG tablet TAKE 1 TABLET BY MOUTH 2 TO 3 TIMES DAILY WITH FOOD AS  NEEDED FOR  INFLAMMATION/PAIN 270 tablet 0   losartan-hydrochlorothiazide (HYZAAR) 50-12.5 MG tablet Take 1 tablet by mouth daily.     Phenylephrine-APAP-guaiFENesin 10-650-400 MG/20ML LIQD Take 20 mLs by mouth every 4 (four) hours as needed (for cough/congestion).     Current Facility-Administered Medications  Medication Dose Route Frequency Provider Last Rate Last Admin   0.9 %  sodium chloride infusion  500 mL Intravenous Continuous Irene Shipper, MD        Allergies:   Patient has no known allergies.    Social History:  The patient  reports that he has quit smoking. His smoking use included cigarettes. He has never used smokeless tobacco. He reports current alcohol use. He reports that he does not use drugs.   Family History:  The patient's family history includes Diabetes in his mother; Hyperlipidemia in his mother; Hypertension in his father and mother.  ROS:  Please see the history of present illness.   Otherwise, review of systems are positive for DOE, dizziness.   All other systems are reviewed and negative.    PHYSICAL EXAM: VS:  BP 124/72   Pulse 71   Ht 6' (1.829 m)   Wt 203 lb 12.8 oz (92.4 kg)   SpO2 98%   BMI 27.64 kg/m  , BMI Body mass index is 27.64 kg/m. GEN: Well nourished, well developed, in no acute distress HEENT: normal Neck: no JVD, carotid bruits, or masses Cardiac: RRR, premature beats; no murmurs, rubs, or gallops,no edema  Respiratory:  clear to auscultation bilaterally, normal work of breathing GI: soft, nontender, nondistended, + BS MS: no deformity or atrophy Skin: warm and dry, no rash Neuro:  Strength and sensation are intact Psych: euthymic mood, full affect   EKG:   The ekg  ordered today demonstrates NSR, PACs, PVCs, PRWP, no ST changes   Recent Labs: No results found for requested labs within last 8760 hours.   Lipid Panel No results found for: CHOL, TRIG, HDL, CHOLHDL, VLDL, LDLCALC, LDLDIRECT   Other studies Reviewed: Additional studies/ records that were reviewed today with results demonstrating: Labs reviewed.   ASSESSMENT AND PLAN:  Palpitations: Noted while checking his BP.  PACs and PVCs noted.  No evidence of atrial fibrillation.  At this point, we will just monitor.  Would consider additional imaging based on his CTA results to look at LV function. Hyperlipidemia: The current medical regimen is effective;  continue present plan and medications.  LDL 102 in 03/2021.  On high potency.  May need to adjust the dose of cholesterol-lowering medicine based on the calcium score from the CT scan.  Continue baby aspirin daily. No bleeding problems.  HTN: The current medical regimen is effective;  continue present plan and medications.  Low-salt diet. PreDM: A1C 6.3.  Whole food, plant-based, high-fiber diet.  Avoid processed foods. DOE: noted while mowing the lawn. Could be anginal equivalent.  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  Will plan for CTA coronaries.  High probability of significant coronary disease given his risk factors.  Negative stress test over 8 years ago.  I think getting a calcium score along with a CTA will aid in his risk factor modification.   Current medicines are reviewed at length with the patient today.  The patient concerns regarding his medicines were addressed.  The following changes have been made:  No change  Labs/ tests ordered today include: CTA No orders of the defined types were placed in this encounter.   Recommend 150 minutes/week of aerobic exercise Low fat, low carb, high fiber diet recommended  Disposition:   FU based in CTA results   Signed, Larae Grooms, MD  07/03/2021 9:06 AM    [address], [place], [place]  [address] Phone: [phone]; Fax: [phone]

## 2021-07-03 ENCOUNTER — Encounter: Payer: Self-pay | Admitting: Interventional Cardiology

## 2021-07-03 ENCOUNTER — Ambulatory Visit: Payer: Medicare Other | Admitting: Interventional Cardiology

## 2021-07-03 ENCOUNTER — Other Ambulatory Visit: Payer: Self-pay

## 2021-07-03 VITALS — BP 124/72 | HR 71 | Ht 72.0 in | Wt 203.8 lb

## 2021-07-03 DIAGNOSIS — I493 Ventricular premature depolarization: Secondary | ICD-10-CM | POA: Diagnosis not present

## 2021-07-03 DIAGNOSIS — I10 Essential (primary) hypertension: Secondary | ICD-10-CM | POA: Diagnosis not present

## 2021-07-03 DIAGNOSIS — R9431 Abnormal electrocardiogram [ECG] [EKG]: Secondary | ICD-10-CM | POA: Diagnosis not present

## 2021-07-03 DIAGNOSIS — R002 Palpitations: Secondary | ICD-10-CM | POA: Diagnosis not present

## 2021-07-03 DIAGNOSIS — R7303 Prediabetes: Secondary | ICD-10-CM | POA: Diagnosis not present

## 2021-07-03 DIAGNOSIS — E782 Mixed hyperlipidemia: Secondary | ICD-10-CM | POA: Diagnosis not present

## 2021-07-03 DIAGNOSIS — R0609 Other forms of dyspnea: Secondary | ICD-10-CM

## 2021-07-03 LAB — BASIC METABOLIC PANEL
BUN/Creatinine Ratio: 12 (ref 10–24)
BUN: 16 mg/dL (ref 8–27)
CO2: 28 mmol/L (ref 20–29)
Calcium: 9.1 mg/dL (ref 8.6–10.2)
Chloride: 102 mmol/L (ref 96–106)
Creatinine, Ser: 1.36 mg/dL — ABNORMAL HIGH (ref 0.76–1.27)
Glucose: 94 mg/dL (ref 70–99)
Potassium: 4.2 mmol/L (ref 3.5–5.2)
Sodium: 139 mmol/L (ref 134–144)
eGFR: 55 mL/min/{1.73_m2} — ABNORMAL LOW (ref >59)

## 2021-07-03 MED ORDER — METOPROLOL TARTRATE 100 MG PO TABS: ORAL_TABLET | ORAL | 0 refills | Status: DC

## 2021-07-03 NOTE — Patient Instructions (Addendum)
Medication Instructions:  Your physician recommends that you continue on your current medications as directed. Please refer to the Current Medication list given to you today.  *If you need a refill on your cardiac medications before your next appointment, please call your pharmacy*   Lab Work: Lab work to be done today--BMP If you have labs (blood work) drawn today and your tests are completely normal, you will receive your results only by: [place] (if you have MyChart) OR A paper copy in the mail If you have any lab test that is abnormal or we need to change your treatment, we will call you to review the results.   Testing/Procedures: Your physician has requested that you have cardiac CT. Cardiac computed tomography (CT) is a painless test that uses an x-ray machine to take clear, detailed pictures of your heart. For further information please visit [email or web address]. Please follow instruction sheet as given.     Follow-Up: At [facility], you and your health needs are our priority.  As part of our continuing mission to provide you with exceptional heart care, we have created designated Provider Care Teams.  These Care Teams include your primary Cardiologist (physician) and Advanced Practice Providers (APPs -  Physician Assistants and Nurse Practitioners) who all work together to provide you with the care you need, when you need it.  We recommend signing up for the patient portal called "MyChart".  Sign up information is provided on this After Visit Summary.  MyChart is used to connect with patients for Virtual Visits (Telemedicine).  Patients are able to view lab/test results, encounter notes, upcoming appointments, etc.  Non-urgent messages can be sent to your provider as well.   To learn more about what you can do with MyChart, go to [email or web address].    Your next appointment:   Based on test results  The format for your next appointment:   In Person  Provider:    You may see Larae Grooms, MD or one of the following Advanced Practice Providers on your designated Care Team:   Melina Copa, PA-C Ermalinda Barrios, PA-C   Other Instructions     Your cardiac CT will be scheduled at one of the below locations:   [facility] [address] [place], [place] [address] [phone]  [place] [address] [place], [place] [address] [phone]  If scheduled at [facility], please arrive at the [facility] main entrance (entrance A) of [facility] 30 minutes prior to test start time. Proceed to the [facility] Radiology Department (first floor) to check-in and test prep.  If scheduled at [facility], please arrive 15 mins early for check-in and test prep.  Please follow these instructions carefully (unless otherwise directed):  Hold all erectile dysfunction medications at least 3 days (72 hrs) prior to test.  On the Night Before the Test: Be sure to Drink plenty of water. Do not consume any caffeinated/decaffeinated beverages or chocolate 12 hours prior to your test. Do not take any antihistamines 12 hours prior to your test.  On the Day of the Test: Drink plenty of water until 1 hour prior to the test. Do not eat any food 4 hours prior to the test. You may take your regular medications prior to the test.  Take metoprolol (Lopressor) two hours prior to test. HOLD Furosemide/Hydrochlorothiazide morning of the test.--Do not take losartan/hydrochlorothiazide the morning of the test  After the Test: Drink plenty of water. After receiving IV contrast, you may experience a mild flushed feeling. This is normal. On occasion, you may experience a mild rash up to 24 hours after the test. This is not dangerous. If this occurs, you can take Benadryl 25 mg and increase your fluid intake. If you experience trouble breathing, this can be  serious. If it is severe call 911 IMMEDIATELY. If it is mild, please call our office. If you take any of these medications: Glipizide/Metformin, Avandament, Glucavance, please do not take 48 hours after completing test unless otherwise instructed.  Please allow 2-4 weeks for scheduling of routine cardiac CTs. Some insurance companies require a pre-authorization which may delay scheduling of this test.   For non-scheduling related questions, please contact the cardiac imaging nurse navigator should you have any questions/concerns: Marchia Bond, Cardiac Imaging Nurse Navigator Gordy Clement, Cardiac Imaging Nurse Navigator [facility] Direct Office Dial: [phone]   For scheduling needs, including cancellations and rescheduling, please call [place], [phone].

## 2021-07-09 ENCOUNTER — Telehealth (HOSPITAL_COMMUNITY): Payer: Self-pay | Admitting: *Deleted

## 2021-07-09 NOTE — Telephone Encounter (Signed)
Attempted to call patient regarding upcoming cardiac CT appointment. °Left message on voicemail with name and callback number ° °Meghna Hagmann RN Navigator Cardiac Imaging °[facility] °[phone] Office °[phone] Cell ° °

## 2021-07-10 ENCOUNTER — Ambulatory Visit (HOSPITAL_COMMUNITY)
Admission: RE | Admit: 2021-07-10 | Discharge: 2021-07-10 | Disposition: A | Discharge status: Home / Self Care | Payer: Medicare Other | Source: Ambulatory Visit | Attending: Interventional Cardiology | Admitting: Interventional Cardiology

## 2021-07-10 ENCOUNTER — Telehealth: Payer: Self-pay | Admitting: Interventional Cardiology

## 2021-07-10 ENCOUNTER — Ambulatory Visit (INDEPENDENT_AMBULATORY_CARE_PROVIDER_SITE_OTHER)
Admission: RE | Admit: 2021-07-10 | Discharge: 2021-07-10 | Disposition: A | Discharge status: Home / Self Care | Payer: Medicare Other | Source: Ambulatory Visit | Attending: Interventional Cardiology | Admitting: Interventional Cardiology

## 2021-07-10 ENCOUNTER — Other Ambulatory Visit: Payer: Self-pay

## 2021-07-10 ENCOUNTER — Encounter: Payer: Self-pay | Admitting: *Deleted

## 2021-07-10 DIAGNOSIS — I10 Essential (primary) hypertension: Secondary | ICD-10-CM | POA: Diagnosis not present

## 2021-07-10 DIAGNOSIS — R911 Solitary pulmonary nodule: Secondary | ICD-10-CM | POA: Diagnosis not present

## 2021-07-10 DIAGNOSIS — I2699 Other pulmonary embolism without acute cor pulmonale: Secondary | ICD-10-CM

## 2021-07-10 DIAGNOSIS — R0609 Other forms of dyspnea: Secondary | ICD-10-CM

## 2021-07-10 DIAGNOSIS — R9431 Abnormal electrocardiogram [ECG] [EKG]: Secondary | ICD-10-CM | POA: Insufficient documentation

## 2021-07-10 MED ORDER — METOPROLOL TARTRATE 5 MG/5ML IV SOLN
5.0000 mg | INTRAVENOUS | Status: DC | PRN
Start: 2021-07-10 — End: 2021-07-11
  Administered 2021-07-10: 5 mg via INTRAVENOUS

## 2021-07-10 MED ORDER — IOHEXOL 350 MG/ML SOLN
75.0000 mL | Freq: Once | INTRAVENOUS | Status: AC | PRN
  Administered 2021-07-10: 75 mL via INTRAVENOUS

## 2021-07-10 MED ORDER — NITROGLYCERIN 0.4 MG SL SUBL
SUBLINGUAL_TABLET | SUBLINGUAL | Status: AC
  Filled 2021-07-10: qty 2

## 2021-07-10 MED ORDER — IOHEXOL 350 MG/ML SOLN
100.0000 mL | Freq: Once | INTRAVENOUS | Status: AC | PRN
  Administered 2021-07-10: 95 mL via INTRAVENOUS

## 2021-07-10 MED ORDER — METOPROLOL TARTRATE 5 MG/5ML IV SOLN
INTRAVENOUS | Status: AC
  Filled 2021-07-10: qty 10

## 2021-07-10 MED ORDER — NITROGLYCERIN 0.4 MG SL SUBL
0.8000 mg | SUBLINGUAL_TABLET | Freq: Once | SUBLINGUAL | Status: AC
  Administered 2021-07-10: 0.8 mg via SUBLINGUAL

## 2021-07-10 MED ORDER — APIXABAN 5 MG PO TABS: 5.0000 mg | ORAL_TABLET | Freq: Two times a day (BID) | ORAL | 3 refills | Status: DC

## 2021-07-10 NOTE — Telephone Encounter (Signed)
OK to order CTA to eval for pulmonary embolism

## 2021-07-10 NOTE — Telephone Encounter (Signed)
Also, only mild CAD noted on CTA.

## 2021-07-10 NOTE — Telephone Encounter (Signed)
I spoke with patient and reviewed results with him.  He will come to [facility] office today at 3:30 for CT Scan

## 2021-07-10 NOTE — Telephone Encounter (Signed)
Instruction letter and Eliquis samples given to patient.  Prescription sent to [facility] on [address].  Patient aware he should not take Ibuprofen or other NSAIDs Patient will come in for lab work on October 26,2022. Will forward to Dr Irish Lack to determine duration of Eliquis and when patient to follow up in office.

## 2021-07-10 NOTE — Telephone Encounter (Signed)
Follow Up:     Erline Levine from [facility] is calling with a Stat Report

## 2021-07-10 NOTE — Telephone Encounter (Signed)
Dr Gasper Sells reviewed CT results with patient. Per Dr Gasper Sells patient to start Eliquis for PE and stop ASA.  He will need a BMP and CBC in one week. Reviewed with Fuller Canada, PharmD and Eliquis dosing is 10 mg twice daily for 7 days and then 5 mg twice daily. Duration to be determined by Dr Irish Lack.

## 2021-07-10 NOTE — Telephone Encounter (Signed)
Jane with [facility] is calling to report urgent findings on patient's cardiac CT, completed today, 10/19.  Phone #: [phone]

## 2021-07-10 NOTE — Telephone Encounter (Signed)
Received called report from [facility] radiology.  Cardiac CT done today shows  Although this study was not optimized for the detection of pulmonary emboli, there are findings suspicious for segmental and subsegmental pulmonary emboli in the lower lobes. CT angiography of the chest with contrast to evaluate for pulmonary emboli is suggested. These results will be called to the ordering clinician   Report is in chart

## 2021-07-10 NOTE — Telephone Encounter (Signed)
Would plan on at least 3 months of anticoagulation.  Not sure if this was provoked or unprovoked DVT.  Please inform his PCP.  Ultimately, may need a hematology eval to determine if it needs to be indefinite. OK to put in referral.   JV

## 2021-07-10 NOTE — Telephone Encounter (Signed)
I spoke with Chris Moore who is calling report of CT scan.   IMPRESSION: 1. Bilateral small lower lobe acute pulmonary emboli. Overall clot burden is mild. No RIGHT ventricular strain. 2. 4 mm right solid pulmonary nodule. No routine follow-up imaging is recommended per Fleischner Society Guidelines. These guidelines do not apply to immunocompromised patients and patients with cancer. Follow up in patients with significant comorbidities as clinically warranted. For lung cancer screening, adhere to Lung-RADS guidelines. Reference: Radiology. 2017; 284(1):228-43

## 2021-07-11 ENCOUNTER — Telehealth: Payer: Self-pay | Admitting: Hematology

## 2021-07-11 NOTE — Telephone Encounter (Signed)
I spoke with patient and gave him information from Dr Irish Lack.  Hematology referral placed.  Will forward this note and CT results to Dr Moreen Fowler.

## 2021-07-11 NOTE — Telephone Encounter (Signed)
Scheduled appt per 10/19 referral. Pt is aware of appt date and time.  

## 2021-07-17 ENCOUNTER — Other Ambulatory Visit: Payer: Medicare Other | Admitting: *Deleted

## 2021-07-17 ENCOUNTER — Other Ambulatory Visit: Payer: Self-pay

## 2021-07-17 DIAGNOSIS — I2699 Other pulmonary embolism without acute cor pulmonale: Secondary | ICD-10-CM

## 2021-07-17 LAB — CBC
Hematocrit: 44.6 % (ref 37.5–51.0)
Hemoglobin: 14.6 g/dL (ref 13.0–17.7)
MCH: 29.7 pg (ref 26.6–33.0)
MCHC: 32.7 g/dL (ref 31.5–35.7)
MCV: 91 fL (ref 79–97)
Platelets: 169 10*3/uL (ref 150–450)
RBC: 4.91 x10E6/uL (ref 4.14–5.80)
RDW: 13 % (ref 11.6–15.4)
WBC: 4.6 10*3/uL (ref 3.4–10.8)

## 2021-07-17 LAB — BASIC METABOLIC PANEL
BUN/Creatinine Ratio: 12 (ref 10–24)
BUN: 16 mg/dL (ref 8–27)
CO2: 29 mmol/L (ref 20–29)
Calcium: 9.1 mg/dL (ref 8.6–10.2)
Chloride: 103 mmol/L (ref 96–106)
Creatinine, Ser: 1.3 mg/dL — ABNORMAL HIGH (ref 0.76–1.27)
Glucose: 77 mg/dL (ref 70–99)
Potassium: 4.7 mmol/L (ref 3.5–5.2)
Sodium: 141 mmol/L (ref 134–144)
eGFR: 58 mL/min/{1.73_m2} — ABNORMAL LOW (ref >59)

## 2021-07-25 ENCOUNTER — Inpatient Hospital Stay: Payer: Medicare Other

## 2021-07-25 ENCOUNTER — Other Ambulatory Visit: Payer: Self-pay

## 2021-07-25 ENCOUNTER — Inpatient Hospital Stay: Payer: Medicare Other | Attending: Hematology | Admitting: Hematology

## 2021-07-25 VITALS — BP 154/98 | HR 72 | Temp 97.7°F | Resp 20 | Wt 202.8 lb

## 2021-07-25 DIAGNOSIS — Z7901 Long term (current) use of anticoagulants: Secondary | ICD-10-CM | POA: Diagnosis not present

## 2021-07-25 DIAGNOSIS — I2694 Multiple subsegmental pulmonary emboli without acute cor pulmonale: Secondary | ICD-10-CM | POA: Diagnosis not present

## 2021-07-25 DIAGNOSIS — I10 Essential (primary) hypertension: Secondary | ICD-10-CM | POA: Insufficient documentation

## 2021-07-25 DIAGNOSIS — R911 Solitary pulmonary nodule: Secondary | ICD-10-CM | POA: Insufficient documentation

## 2021-07-25 DIAGNOSIS — Z8249 Family history of ischemic heart disease and other diseases of the circulatory system: Secondary | ICD-10-CM | POA: Insufficient documentation

## 2021-07-25 DIAGNOSIS — R0602 Shortness of breath: Secondary | ICD-10-CM | POA: Insufficient documentation

## 2021-07-25 DIAGNOSIS — Z79890 Hormone replacement therapy (postmenopausal): Secondary | ICD-10-CM | POA: Insufficient documentation

## 2021-07-25 DIAGNOSIS — Z87891 Personal history of nicotine dependence: Secondary | ICD-10-CM | POA: Diagnosis not present

## 2021-07-25 LAB — CMP (CANCER CENTER ONLY)
ALT: 20 U/L (ref 0–44)
AST: 27 U/L (ref 15–41)
Albumin: 3.6 g/dL (ref 3.5–5.0)
Alkaline Phosphatase: 62 U/L (ref 38–126)
Anion gap: 8 (ref 5–15)
BUN: 17 mg/dL (ref 8–23)
CO2: 27 mmol/L (ref 22–32)
Calcium: 8.9 mg/dL (ref 8.9–10.3)
Chloride: 105 mmol/L (ref 98–111)
Creatinine: 1.41 mg/dL — ABNORMAL HIGH (ref 0.61–1.24)
GFR, Estimated: 52 mL/min — ABNORMAL LOW (ref >60)
Glucose, Bld: 112 mg/dL — ABNORMAL HIGH (ref 70–99)
Potassium: 4.3 mmol/L (ref 3.5–5.1)
Sodium: 140 mmol/L (ref 135–145)
Total Bilirubin: 0.6 mg/dL (ref 0.3–1.2)
Total Protein: 7.1 g/dL (ref 6.5–8.1)

## 2021-07-25 LAB — CBC WITH DIFFERENTIAL/PLATELET
Abs Immature Granulocytes: 0.01 10*3/uL (ref 0.00–0.07)
Basophils Absolute: 0 10*3/uL (ref 0.0–0.1)
Basophils Relative: 0 %
Eosinophils Absolute: 0 10*3/uL (ref 0.0–0.5)
Eosinophils Relative: 0 %
HCT: 44.7 % (ref 39.0–52.0)
Hemoglobin: 14.1 g/dL (ref 13.0–17.0)
Immature Granulocytes: 0 %
Lymphocytes Relative: 35 %
Lymphs Abs: 1.1 10*3/uL (ref 0.7–4.0)
MCH: 29.5 pg (ref 26.0–34.0)
MCHC: 31.5 g/dL (ref 30.0–36.0)
MCV: 93.5 fL (ref 80.0–100.0)
Monocytes Absolute: 0.5 10*3/uL (ref 0.1–1.0)
Monocytes Relative: 17 %
Neutro Abs: 1.5 10*3/uL — ABNORMAL LOW (ref 1.7–7.7)
Neutrophils Relative %: 48 %
Platelets: 154 10*3/uL (ref 150–400)
RBC: 4.78 MIL/uL (ref 4.22–5.81)
RDW: 13.8 % (ref 11.5–15.5)
WBC: 3.2 10*3/uL — ABNORMAL LOW (ref 4.0–10.5)
nRBC: 0 % (ref 0.0–0.2)

## 2021-07-25 LAB — ANTITHROMBIN III: AntiThromb III Func: 108 % (ref 75–120)

## 2021-07-25 LAB — D-DIMER, QUANTITATIVE: D-Dimer, Quant: 0.38 ug/mL-FEU (ref 0.00–0.50)

## 2021-07-25 NOTE — Progress Notes (Addendum)
[facility]   HEMATOLOGY/ONCOLOGY CONSULTATION NOTE  Date of Service: 07/25/2021  Patient Care Team: Antony Contras, MD as PCP - General (Family Medicine) Jettie Booze, MD as PCP - Cardiology (Cardiology)  CHIEF COMPLAINTS/PURPOSE OF CONSULTATION:  Pulmonary embolism  HISTORY OF PRESENTING ILLNESS:   Chris Moore is a wonderful 75 y.o. male who has been referred to [place] by Dr .Antony Contras, MD  for evaluation and management of Pulmonary embolism.  History of hypertension, dyslipidemia and had a CT coronary angiogram but this cardiologist Dr. Beau Fanny on 07/10/2021 for evaluation of exertional dyspnea.  This showed minimal mixed nonobstructive CAD and was incidentally noted to have bilateral subsegmental pulmonary artery filling defects concerning for PE.  Patient subsequently had a CTA of the chest to evaluate PE on the same day which showed bilateral small lower lobe acute pulmonary emboli. Overall clot burden is mild. No RIGHT ventricular strain. 4 mm right solid pulmonary nodule.   Patient was started on Eliquis and referred to [place] for further evaluation.  Reports that he has had some palpitations and exertional dyspnea for about a month or 2.  No chest pain. No new leg swelling, pain redness or increased warmth. No recent fevers chills night sweats.  No definitive symptoms suspicious for malignancy.  Patient reports he is up to speed with age-appropriate cancer screening with his primary care physician.  No new bowel or urinary symptoms.  No unexpected weight loss.  Patient reports no reason for extended immobility.  No recent long distance travel.  No recent surgeries. Remote history of smoking smoked since age 60yrs Denies excessive alcohol use. .Body mass index is 27.5 kg/m. Hormonal use such as testosterone replacement. no recent COVID-19 infection  Patient is tolerating his anticoagulation well and feels that the shortness of breath has gotten somewhat  better.  MEDICAL HISTORY:  Past Medical History:  Diagnosis Date   Allergy    Arthritis    Hypercholesteremia    Hypertension    Internal hemorrhoids    Prediabetes     SURGICAL HISTORY: Past Surgical History:  Procedure Laterality Date   COLONOSCOPY     left knee arthroscopic surgery     POLYPECTOMY      SOCIAL HISTORY: Social History   Socioeconomic History   Marital status: Married    Spouse name: Not on file   Number of children: Not on file   Years of education: Not on file   Highest education level: Not on file  Occupational History   Not on file  Tobacco Use   Smoking status: Former    Types: Cigarettes   Smokeless tobacco: Never   Tobacco comments:    quit at age 35  Vaping Use   Vaping Use: Never used  Substance and Sexual Activity   Alcohol use: Yes    Alcohol/week: 0.0 standard drinks    Comment: socially   Drug use: No   Sexual activity: Not on file  Other Topics Concern   Not on file  Social History Narrative   Not on file   Social Determinants of Health   Financial Resource Strain: Not on file  Food Insecurity: Not on file  Transportation Needs: Not on file  Physical Activity: Not on file  Stress: Not on file  Social Connections: Not on file  Intimate Partner Violence: Not on file    FAMILY HISTORY: Family History  Problem Relation Age of Onset   Hypertension Mother    Diabetes Mother    Hyperlipidemia Mother  Hypertension Father    Colon polyps Neg Hx    Colon cancer Neg Hx    Esophageal cancer Neg Hx    Rectal cancer Neg Hx    Stomach cancer Neg Hx     ALLERGIES:  has No Known Allergies.  MEDICATIONS:  Current Outpatient Medications  Medication Sig Dispense Refill   acetaminophen (TYLENOL) 500 MG tablet Take 1,000 mg by mouth every 6 (six) hours as needed for mild pain, moderate pain, fever or headache.     apixaban (ELIQUIS) 5 MG TABS tablet Take 1 tablet (5 mg total) by mouth 2 (two) times daily. 60 tablet 3    atorvastatin (LIPITOR) 40 MG tablet Take 40 mg by mouth at bedtime.      fexofenadine (ALLEGRA) 60 MG tablet Take 1 tablet (60 mg total) by mouth 2 (two) times daily. 60 tablet 0   fluticasone (FLONASE) 50 MCG/ACT nasal spray Place 2 sprays into both nostrils daily. 16 g 0   losartan-hydrochlorothiazide (HYZAAR) 50-12.5 MG tablet Take 1 tablet by mouth daily.     benzonatate (TESSALON) 100 MG capsule Take 1 capsule (100 mg total) by mouth at bedtime as needed for cough. 15 capsule 0   diphenhydrAMINE (BENADRYL) 25 MG tablet Take 25 mg by mouth at bedtime as needed.     guaiFENesin-dextromethorphan (ROBITUSSIN DM) 100-10 MG/5ML syrup Take 5 mLs by mouth every 4 (four) hours as needed for cough (and congestion). 118 mL 0   metoprolol tartrate (LOPRESSOR) 100 MG tablet Take one tablet by mouth 2 hours prior to CT Scan (Patient not taking: Reported on 07/25/2021) 1 tablet 0   Phenylephrine-APAP-guaiFENesin 10-650-400 MG/20ML LIQD Take 20 mLs by mouth every 4 (four) hours as needed (for cough/congestion). (Patient not taking: Reported on 07/25/2021)     Current Facility-Administered Medications  Medication Dose Route Frequency Provider Last Rate Last Admin   0.9 %  sodium chloride infusion  500 mL Intravenous Continuous Irene Shipper, MD        REVIEW OF SYSTEMS:    .10 Point review of Systems was done is negative except as noted above.   PHYSICAL EXAMINATION: ECOG PERFORMANCE STATUS: 1 - Symptomatic but completely ambulatory  . Vitals:   07/25/21 1040  BP: (!) 154/98  Pulse: 72  Resp: 20  Temp: 97.7 F (36.5 C)  SpO2: 99%   Filed Weights   07/25/21 1040  Weight: 202 lb 12.8 oz (92 kg)   .Body mass index is 27.5 kg/m. [facility] Kitchen GENERAL:alert, in no acute distress and comfortable SKIN: no acute rashes, no significant lesions EYES: conjunctiva are pink and non-injected, sclera anicteric OROPHARYNX: MMM, no exudates, no oropharyngeal erythema or ulceration NECK: supple, no JVD LYMPH:  no  palpable lymphadenopathy in the cervical, axillary or inguinal regions LUNGS: clear to auscultation b/l with normal respiratory effort HEART: regular rate & rhythm ABDOMEN:  normoactive bowel sounds , non tender, not distended. Extremity: no pedal edema PSYCH: alert & oriented x 3 with fluent speech NEURO: no focal motor/sensory deficits   LABORATORY DATA:  I have reviewed the data as listed  . CBC Latest Ref Rng & Units 07/25/2021 07/17/2021 12/29/2016  WBC 4.0 - 10.5 K/uL 3.2(L) 4.6 15.0(H)  Hemoglobin 13.0 - 17.0 g/dL 14.1 14.6 13.5  Hematocrit 39.0 - 52.0 % 44.7 44.6 41.2  Platelets 150 - 400 K/uL 154 169 254    . CMP Latest Ref Rng & Units 07/25/2021 07/17/2021 07/03/2021  Glucose 70 - 99 mg/dL 112(H) 77 94  BUN  8 - 23 mg/dL 17 16 16   Creatinine 0.61 - 1.24 mg/dL 1.41(H) 1.30(H) 1.36(H)  Sodium 135 - 145 mmol/L 140 141 139  Potassium 3.5 - 5.1 mmol/L 4.3 4.7 4.2  Chloride 98 - 111 mmol/L 105 103 102  CO2 22 - 32 mmol/L 27 29 28   Calcium 8.9 - 10.3 mg/dL 8.9 9.1 9.1  Total Protein 6.5 - 8.1 g/dL 7.1 - -  Total Bilirubin 0.3 - 1.2 mg/dL 0.6 - -  Alkaline Phos 38 - 126 U/L 62 - -  AST 15 - 41 U/L 27 - -  ALT 0 - 44 U/L 20 - -     RADIOGRAPHIC STUDIES: I have personally reviewed the radiological images as listed and agreed with the findings in the report. CT Angio Chest Pulmonary Embolism (PE) W or WO Contrast  Result Date: 07/10/2021 CLINICAL DATA:  Concern for acute pulmonary embolism on cardiac CTA. EXAM: CT ANGIOGRAPHY CHEST WITH CONTRAST TECHNIQUE: Multidetector CT imaging of the chest was performed using the standard protocol during bolus administration of intravenous contrast. Multiplanar CT image reconstructions and MIPs were obtained to evaluate the vascular anatomy. CONTRAST:  57mL OMNIPAQUE IOHEXOL 350 MG/ML SOLN COMPARISON:  CT a 07/10/2021 FINDINGS: Cardiovascular: Within a segmental branch of the RIGHT lower lobe, there is a tubular filling defect on image  197/series 5. This is nonocclusive and small volume. Similar tubular filling defect within a segmental branch of the LEFT lower lobe on image 182/series 5. There is also nonocclusive. No upper lobe pulmonary emboli identified. No RIGHT ventricular strain. Ascending thoracic aorta measures 40 mm at the level of pulmonary outflow tract Mediastinum/Nodes: No axillary or supraclavicular adenopathy. No mediastinal or hilar adenopathy. No pericardial fluid. Esophagus normal. Lungs/Pleura: 4 mm nodule in the RIGHT lower lobe (image 76/series 6). Upper Abdomen: Limited view of the liver, kidneys, pancreas are unremarkable. Normal adrenal glands. Musculoskeletal: No chest wall abnormality. No acute or significant osseous findings. Review of the MIP images confirms the above findings. IMPRESSION: 1. Bilateral small lower lobe acute pulmonary emboli. Overall clot burden is mild. No RIGHT ventricular strain. 2. 4 mm right solid pulmonary nodule. No routine follow-up imaging is recommended per Fleischner Society Guidelines. These guidelines do not apply to immunocompromised patients and patients with cancer. Follow up in patients with significant comorbidities as clinically warranted. For lung cancer screening, adhere to Lung-RADS guidelines. Reference: Radiology. 2017; 284(1):228-43 These results will be called to the ordering clinician or representative by the Radiologist Assistant, and communication documented in the PACS or [employer]. Electronically Signed   By: Suzy Bouchard M.D.   On: 07/10/2021 15:43   CT CORONARY MORPH W/CTA COR W/SCORE W/CA W/CM &/OR WO/CM  Addendum Date: 07/10/2021   ADDENDUM REPORT: 07/10/2021 12:33 HISTORY: 75 yo male with dyspnea on exertion (DOE) EXAM: Cardiac/Coronary CTA TECHNIQUE: The patient was scanned on a Arboriculturist. PROTOCOL: A 110 kV prospective scan was triggered in the descending thoracic aorta at 111 HU's. Axial non-contrast 3 mm slices were carried out through  the heart. The data set was analyzed on a dedicated work station and scored using the Venetie. Gantry rotation speed was 250 msecs and collimation was .6 mm. Beta blockade and 0.8 mg of sl NTG was given. The 3D data set was reconstructed in 10% intervals of the 0-90 % of the R-R cycle. Diastolic phases were analyzed on a dedicated work station using MPR, MIP and VRT modes. The patient received 6mL OMNIPAQUE IOHEXOL 350 MG/ML SOLN of  contrast. FINDINGS: Quality: Fair to poor, significant mis-registration artifact, motion artifact, HR 51 Coronary calcium score: The patient's coronary artery calcium score is 31.3, which places the patient in the 40th percentile. Coronary arteries: Normal coronary origins.  Right dominance. Right Coronary Artery: Notwithstanding mid-vessel artifact, there is no significant CAD. Left Main Coronary Artery: Normal. Bifurcates into the LAD and LCx arteries. Left Anterior Descending Coronary Artery: Anterior vessel which reaches the apex. Minimal mixed proximal stenosis 1-24% (CADRADS1). Single mid-vessel diagonal branch without disease. Left Circumflex Artery: AV groove vessel. No significant disease. Several OM branches without disease. Aorta: Dilated to 42 mm at the mid ascending aorta (level of the PA bifurcation) measured double oblique. No calcifications. No dissection. Aortic Valve: Trileaflet. No calcifications. Other findings: Normal pulmonary vein drainage into the left atrium. Normal left atrial appendage without a thrombus. Normal size of the pulmonary artery. IMPRESSION: 1. Minimal mixed non-obstructive CAD, CADRADS = 1. 2. Coronary calcium score of 31.3. This was 40th percentile for age and sex matched control. 3. Normal coronary origin with right dominance. 4. Aorta: Dilated to 42 mm at the mid ascending aorta (level of the PA bifurcation) measured double oblique. No calcifications. No dissection. 5. See radiology over-read about bilateral subsegmental pulmonary artery  filling defects concerning for pulmonary emboli Electronically Signed   By: Pixie Casino M.D.   On: 07/10/2021 12:33   Result Date: 07/10/2021 EXAM: OVER-READ INTERPRETATION  CT CHEST The following report is an over-read performed by radiologist Dr. Lorin Picket of [facility], [place] on 07/10/2021. This over-read does not include interpretation of cardiac or coronary anatomy or pathology. The coronary calcium score/coronary CTA interpretation by the cardiologist is attached. COMPARISON:  None. FINDINGS: Vascular: Venous mixing artifact bilaterally. There may be filling defects, however, in the lower lobe segmental and subsegmental pulmonary arteries. Ascending aorta measures 4.2 cm. Mediastinum/Nodes: No pathologically enlarged lymph nodes. Esophagus is grossly unremarkable. Lungs/Pleura: Image quality is degraded by respiratory motion. No suspicious pulmonary nodules. No pleural fluid. Airway is unremarkable. Upper Abdomen: None. Musculoskeletal: Mild degenerative changes in the spine. IMPRESSION: 1. Although this study was not optimized for the detection of pulmonary emboli, there are findings suspicious for segmental and subsegmental pulmonary emboli in the lower lobes. CT angiography of the chest with contrast to evaluate for pulmonary emboli is suggested. These results will be called to the ordering clinician or representative by the Radiologist Assistant, and communication documented in the PACS or [employer]. 2. Ascending aortic aneurysm. Recommend annual imaging followup by CTA or MRA. This recommendation follows 2010 ACCF/AHA/AATS/ACR/ASA/SCA/SCAI/SIR/STS/SVM Guidelines for the Diagnosis and Management of Patients with Thoracic Aortic Disease. Circulation. 2010; 121: Z610-R604. Aortic aneurysm NOS (ICD10-I71.9). Electronically Signed: By: Lorin Picket M.D. On: 07/10/2021 10:16    ASSESSMENT & PLAN:   75 year old male with history of hypertension, prediabetes, dyslipidemia  admitted  1) Recently diagnosed bilateral small subsegmental lower lobe Pulmonary Emboli.  Overall low clot burden with no right heart strain. PLAN -Discussed imaging findings of patient's reported bilateral subsegmental PEs.  These were incidentally noted though he is reported palpitations and exertional dyspnea could potentially be related to this. CTA of the chest did not show significant obstructive CAD. The timeline of the symptoms is not very well-defined to know exactly when this event might have occurred. -Patient does not have any obvious reported provoking events. -Discussed potential etiologies for VTE. -Discussed that 80% of the PE is coming from the lower extremity venous circulation.  Would recommend repeating bilateral lower extremity venous  ultrasounds to evaluate for DVT. -Discussed pros and cons of getting a hypercoagulable work-up in the absence of an obvious provoking event.  Patient is agreeable to this.  He does not have an obvious family history of hypercoagulable state.  He previously has not had any personal history of VTE. -Hypercoagulability work-up ordered as noted below -Continue Eliquis at this time.  Duration of anticoagulation would be a function of the results of his work-up. -Discussed recommendations to reduce the risk of venous thromboembolism including  keeping well-hydrated, staying active, considering use of lower extremity compression socks especially while traveling etc. . Orders Placed This Encounter  Procedures   CBC with Differential/Platelet    Standing Status:   Future    Number of Occurrences:   1    Standing Expiration Date:   07/25/2022   CMP ([place] only)    Standing Status:   Future    Number of Occurrences:   1    Standing Expiration Date:   07/25/2022   D-dimer, quantitative    Standing Status:   Future    Number of Occurrences:   1    Standing Expiration Date:   07/25/2022   Antithrombin III    Standing Status:   Future     Number of Occurrences:   1    Standing Expiration Date:   07/25/2022   Protein C activity    Standing Status:   Future    Number of Occurrences:   1    Standing Expiration Date:   07/25/2022   Protein C, total    Standing Status:   Future    Number of Occurrences:   1    Standing Expiration Date:   07/25/2022   Protein S activity    Standing Status:   Future    Number of Occurrences:   1    Standing Expiration Date:   07/25/2022   Protein S, total    Standing Status:   Future    Number of Occurrences:   1    Standing Expiration Date:   07/25/2022   Lupus anticoagulant panel    Standing Status:   Future    Number of Occurrences:   1    Standing Expiration Date:   07/25/2022   Beta-2-glycoprotein i abs, IgG/M/A    Standing Status:   Future    Number of Occurrences:   1    Standing Expiration Date:   07/25/2022   Homocysteine, serum    Standing Status:   Future    Number of Occurrences:   1    Standing Expiration Date:   07/25/2022   Factor 5 leiden    Standing Status:   Future    Number of Occurrences:   1    Standing Expiration Date:   07/25/2022   Cardiolipin antibodies, IgG, IgM, IgA    Standing Status:   Future    Number of Occurrences:   1    Standing Expiration Date:   07/25/2022   Prothrombin gene mutation    Standing Status:   Future    Number of Occurrences:   1    Standing Expiration Date:   07/25/2022   Hexagonal Phospholipid Neutralization    Standing Status:   Future    Number of Occurrences:   1    Standing Expiration Date:   07/25/2022   Follow-up Labs today Bilateral lower extremity venous ultrasound in 1week Phone visit with Dr. Irene Limbo in 2 weeks   All of  the patients questions were answered with apparent satisfaction. The patient knows to call the clinic with any problems, questions or concerns.  I spent  40 mins counseling the patient face to face. The total time spent in the appointment was 60 mins and more than 50% was on counseling and direct patient  cares.    Sullivan Lone MD MS AAHIVMS [facility] Hematology/Oncology Physician [facility]  (Office):       [phone] (Work cell):  [phone] (Fax):           [phone]  07/25/2021 11:16 AM   ADDENDUM  [place] bilateral lower extremity venous - 11/10 -: No evidence of DVT

## 2021-07-26 LAB — HOMOCYSTEINE: Homocysteine: 15.8 umol/L (ref 0.0–19.2)

## 2021-07-27 LAB — PROTEIN S ACTIVITY: Protein S Activity: 95 % (ref 63–140)

## 2021-07-27 LAB — DRVVT CONFIRM: dRVVT Confirm: 1 ratio (ref 0.8–1.2)

## 2021-07-27 LAB — BETA-2-GLYCOPROTEIN I ABS, IGG/M/A
Beta-2 Glyco I IgG: <9 GPI IgG units (ref 0–20)
Beta-2-Glycoprotein I IgA: <9 GPI IgA units (ref 0–25)
Beta-2-Glycoprotein I IgM: <9 GPI IgM units (ref 0–32)

## 2021-07-27 LAB — LUPUS ANTICOAGULANT PANEL
DRVVT: 56.9 s — ABNORMAL HIGH (ref 0.0–47.0)
PTT Lupus Anticoagulant: 32.9 s (ref 0.0–51.9)

## 2021-07-27 LAB — PROTEIN C ACTIVITY: Protein C Activity: 135 % (ref 73–180)

## 2021-07-27 LAB — CARDIOLIPIN ANTIBODIES, IGG, IGM, IGA
Anticardiolipin IgA: <9 APL U/mL (ref 0–11)
Anticardiolipin IgG: <9 GPL U/mL (ref 0–14)
Anticardiolipin IgM: <9 MPL U/mL (ref 0–12)

## 2021-07-27 LAB — DRVVT MIX: dRVVT Mix: 48.6 s — ABNORMAL HIGH (ref 0.0–40.4)

## 2021-07-27 LAB — PROTEIN S, TOTAL: Protein S Ag, Total: 128 % (ref 60–150)

## 2021-07-29 LAB — HEXAGONAL PHOSPHOLIPID NEUTRALIZATION: Hexagonal Phospholipid Neutral: 0 s

## 2021-07-30 LAB — FACTOR 5 LEIDEN

## 2021-07-31 LAB — PROTHROMBIN GENE MUTATION

## 2021-08-01 ENCOUNTER — Other Ambulatory Visit: Payer: Self-pay

## 2021-08-01 ENCOUNTER — Ambulatory Visit (HOSPITAL_COMMUNITY)
Admission: RE | Admit: 2021-08-01 | Discharge: 2021-08-01 | Disposition: A | Discharge status: Home / Self Care | Payer: Medicare Other | Source: Ambulatory Visit | Attending: Hematology | Admitting: Hematology

## 2021-08-01 DIAGNOSIS — I2694 Multiple subsegmental pulmonary emboli without acute cor pulmonale: Secondary | ICD-10-CM | POA: Insufficient documentation

## 2021-08-01 LAB — PROTEIN C, TOTAL: Protein C, Total: 114 % (ref 60–150)

## 2021-08-01 NOTE — Telephone Encounter (Signed)
Reviewed with Dr Irish Lack and patient can follow up with cardiology as needed.

## 2021-08-01 NOTE — Addendum Note (Signed)
Encounter addended by: Legrand Como, RVT on: 08/01/2021 9:35 AM  Actions taken: Imaging Exam ended, Clinical Note Signed

## 2021-08-01 NOTE — Progress Notes (Signed)
Bilateral lower extremity venous duplex has been completed. Preliminary results can be found in CV Proc through chart review.  Results were given to Dr. Irene Limbo.  08/01/21 9:16 AM Chris Moore RVT

## 2021-08-08 ENCOUNTER — Inpatient Hospital Stay (HOSPITAL_BASED_OUTPATIENT_CLINIC_OR_DEPARTMENT_OTHER): Payer: Medicare Other | Admitting: Hematology

## 2021-08-08 DIAGNOSIS — I2694 Multiple subsegmental pulmonary emboli without acute cor pulmonale: Secondary | ICD-10-CM | POA: Diagnosis not present

## 2021-08-08 MED ORDER — APIXABAN 5 MG PO TABS: 5.0000 mg | ORAL_TABLET | Freq: Two times a day (BID) | ORAL | 4 refills | Status: DC

## 2021-08-09 ENCOUNTER — Telehealth: Payer: Self-pay | Admitting: Hematology

## 2021-08-09 NOTE — Telephone Encounter (Signed)
Scheduled per 11/17 los, pt has been called and confirmed appt

## 2021-08-19 NOTE — Progress Notes (Signed)
Marland Kitchen   HEMATOLOGY/ONCOLOGY PHONE VISIT NOTE  Date of Service: 08/08/2021  Patient Care Team: Antony Contras, MD as PCP - General (Family Medicine) Jettie Booze, MD as PCP - Cardiology (Cardiology)  CHIEF COMPLAINTS/PURPOSE OF CONSULTATION:  Pulmonary embolism  HISTORY OF PRESENTING ILLNESS:   Chris Moore is a wonderful 75 y.o. male who has been referred to [place] by Dr .Antony Contras, MD  for evaluation and management of Pulmonary embolism.  History of hypertension, dyslipidemia and had a CT coronary angiogram but this cardiologist Dr. Beau Fanny on 07/10/2021 for evaluation of exertional dyspnea.  This showed minimal mixed nonobstructive CAD and was incidentally noted to have bilateral subsegmental pulmonary artery filling defects concerning for PE.  Patient subsequently had a CTA of the chest to evaluate PE on the same day which showed bilateral small lower lobe acute pulmonary emboli. Overall clot burden is mild. No RIGHT ventricular strain. 4 mm right solid pulmonary nodule.   Patient was started on Eliquis and referred to [place] for further evaluation.  Reports that he has had some palpitations and exertional dyspnea for about a month or 2.  No chest pain. No new leg swelling, pain redness or increased warmth. No recent fevers chills night sweats.  No definitive symptoms suspicious for malignancy.  Patient reports he is up to speed with age-appropriate cancer screening with his primary care physician.  No new bowel or urinary symptoms.  No unexpected weight loss.  Patient reports no reason for extended immobility.  No recent long distance travel.  No recent surgeries. Remote history of smoking smoked since age 89yrs Denies excessive alcohol use. .There is no height or weight on file to calculate BMI. Denies Hormonal use such as testosterone replacement. no recent COVID-19 infection  Patient is tolerating his anticoagulation well and feels that the shortness of breath has  gotten somewhat better.  INTERVAL HISTORY .I connected with Chris Moore on 08/19/21 at  2:40 PM EST by telephone visit and verified that I am speaking with the correct person using two identifiers.   I discussed the limitations, risks, security and privacy concerns of performing an evaluation and management service by telemedicine and the availability of in-person appointments. I also discussed with the patient that there may be a patient responsible charge related to this service. The patient expressed understanding and agreed to proceed.   Patient's location: Home  Provider's location: [facility]   Chief Complaint: PE  Patient was called to discuss his lab work-up for hypercoagulability in the setting of his unprovoked PE. We discussed his work-up and lab results in detail which showed no overt evidence of a primary hypercoagulable state. Patient has no chest pain no shortness of breath no new leg pain or swelling.  MEDICAL HISTORY:  Past Medical History:  Diagnosis Date   Allergy    Arthritis    Hypercholesteremia    Hypertension    Internal hemorrhoids    Prediabetes     SURGICAL HISTORY: Past Surgical History:  Procedure Laterality Date   COLONOSCOPY     left knee arthroscopic surgery     POLYPECTOMY      SOCIAL HISTORY: Social History   Socioeconomic History   Marital status: Married    Spouse name: Not on file   Number of children: Not on file   Years of education: Not on file   Highest education level: Not on file  Occupational History   Not on file  Tobacco Use   Smoking status:  Former    Types: Cigarettes   Smokeless tobacco: Never   Tobacco comments:    quit at age 21  Vaping Use   Vaping Use: Never used  Substance and Sexual Activity   Alcohol use: Yes    Alcohol/week: 0.0 standard drinks    Comment: socially   Drug use: No   Sexual activity: Not on file  Other Topics Concern   Not on file  Social History Narrative    Not on file   Social Determinants of Health   Financial Resource Strain: Not on file  Food Insecurity: Not on file  Transportation Needs: Not on file  Physical Activity: Not on file  Stress: Not on file  Social Connections: Not on file  Intimate Partner Violence: Not on file    FAMILY HISTORY: Family History  Problem Relation Age of Onset   Hypertension Mother    Diabetes Mother    Hyperlipidemia Mother    Hypertension Father    Colon polyps Neg Hx    Colon cancer Neg Hx    Esophageal cancer Neg Hx    Rectal cancer Neg Hx    Stomach cancer Neg Hx     ALLERGIES:  has No Known Allergies.  MEDICATIONS:  Current Outpatient Medications  Medication Sig Dispense Refill   acetaminophen (TYLENOL) 500 MG tablet Take 1,000 mg by mouth every 6 (six) hours as needed for mild pain, moderate pain, fever or headache.     apixaban (ELIQUIS) 5 MG TABS tablet Take 1 tablet (5 mg total) by mouth 2 (two) times daily. 60 tablet 4   atorvastatin (LIPITOR) 40 MG tablet Take 40 mg by mouth at bedtime.      benzonatate (TESSALON) 100 MG capsule Take 1 capsule (100 mg total) by mouth at bedtime as needed for cough. 15 capsule 0   diphenhydrAMINE (BENADRYL) 25 MG tablet Take 25 mg by mouth at bedtime as needed.     fexofenadine (ALLEGRA) 60 MG tablet Take 1 tablet (60 mg total) by mouth 2 (two) times daily. 60 tablet 0   fluticasone (FLONASE) 50 MCG/ACT nasal spray Place 2 sprays into both nostrils daily. 16 g 0   guaiFENesin-dextromethorphan (ROBITUSSIN DM) 100-10 MG/5ML syrup Take 5 mLs by mouth every 4 (four) hours as needed for cough (and congestion). 118 mL 0   losartan-hydrochlorothiazide (HYZAAR) 50-12.5 MG tablet Take 1 tablet by mouth daily.     metoprolol tartrate (LOPRESSOR) 100 MG tablet Take one tablet by mouth 2 hours prior to CT Scan (Patient not taking: Reported on 07/25/2021) 1 tablet 0   Phenylephrine-APAP-guaiFENesin 10-650-400 MG/20ML LIQD Take 20 mLs by mouth every 4 (four) hours as  needed (for cough/congestion). (Patient not taking: Reported on 07/25/2021)     Current Facility-Administered Medications  Medication Dose Route Frequency Provider Last Rate Last Admin   0.9 %  sodium chloride infusion  500 mL Intravenous Continuous Irene Shipper, MD        REVIEW OF SYSTEMS:    .10 Point review of Systems was done is negative except as noted above.   PHYSICAL EXAMINATION: Telemedicine visit  LABORATORY DATA:  I have reviewed the data as listed  . CBC Latest Ref Rng & Units 07/25/2021 07/17/2021 12/29/2016  WBC 4.0 - 10.5 K/uL 3.2(L) 4.6 15.0(H)  Hemoglobin 13.0 - 17.0 g/dL 14.1 14.6 13.5  Hematocrit 39.0 - 52.0 % 44.7 44.6 41.2  Platelets 150 - 400 K/uL 154 169 254    . CMP Latest Ref Rng &  Units 07/25/2021 07/17/2021 07/03/2021  Glucose 70 - 99 mg/dL 112(H) 77 94  BUN 8 - 23 mg/dL 17 16 16   Creatinine 0.61 - 1.24 mg/dL 1.41(H) 1.30(H) 1.36(H)  Sodium 135 - 145 mmol/L 140 141 139  Potassium 3.5 - 5.1 mmol/L 4.3 4.7 4.2  Chloride 98 - 111 mmol/L 105 103 102  CO2 22 - 32 mmol/L 27 29 28   Calcium 8.9 - 10.3 mg/dL 8.9 9.1 9.1  Total Protein 6.5 - 8.1 g/dL 7.1 - -  Total Bilirubin 0.3 - 1.2 mg/dL 0.6 - -  Alkaline Phos 38 - 126 U/L 62 - -  AST 15 - 41 U/L 27 - -  ALT 0 - 44 U/L 20 - -     RADIOGRAPHIC STUDIES: I have personally reviewed the radiological images as listed and agreed with the findings in the report. VAS [place] LOWER EXTREMITY VENOUS (DVT)  Result Date: 08/01/2021  Lower Venous DVT Study Patient Name:  Chris Moore  Date of Exam:   08/01/2021 Medical Rec #: [id number]             Accession #:    [id number] Date of Birth: [birth year]             Patient Gender: M Patient Age:   10 years Exam Location:  [facility] Procedure:      VAS [place] LOWER EXTREMITY VENOUS (DVT) Referring Phys: Sullivan Lone --------------------------------------------------------------------------------  Indications: Pulmonary embolism.  Risk Factors: Confirmed PE.  Anticoagulation: Eliquis. Comparison Study: No prior studies. Performing Technologist: Oliver Hum RVT  Examination Guidelines: A complete evaluation includes B-mode imaging, spectral Doppler, color Doppler, and power Doppler as needed of all accessible portions of each vessel. Bilateral testing is considered an integral part of a complete examination. Limited examinations for reoccurring indications may be performed as noted. The reflux portion of the exam is performed with the patient in reverse Trendelenburg.  +---------+---------------+---------+-----------+----------+--------------+ RIGHT    CompressibilityPhasicitySpontaneityPropertiesThrombus Aging +---------+---------------+---------+-----------+----------+--------------+ CFV      Full           Yes      Yes                                 +---------+---------------+---------+-----------+----------+--------------+ SFJ      Full                                                        +---------+---------------+---------+-----------+----------+--------------+ FV Prox  Full                                                        +---------+---------------+---------+-----------+----------+--------------+ FV Mid   Full                                                        +---------+---------------+---------+-----------+----------+--------------+ FV DistalFull                                                        +---------+---------------+---------+-----------+----------+--------------+  PFV      Full                                                        +---------+---------------+---------+-----------+----------+--------------+ POP      Full           Yes      Yes                                 +---------+---------------+---------+-----------+----------+--------------+ PTV      Full                                                         +---------+---------------+---------+-----------+----------+--------------+ PERO     Full                                                        +---------+---------------+---------+-----------+----------+--------------+   +---------+---------------+---------+-----------+----------+--------------+ LEFT     CompressibilityPhasicitySpontaneityPropertiesThrombus Aging +---------+---------------+---------+-----------+----------+--------------+ CFV      Full           Yes      Yes                                 +---------+---------------+---------+-----------+----------+--------------+ SFJ      Full                                                        +---------+---------------+---------+-----------+----------+--------------+ FV Prox  Full                                                        +---------+---------------+---------+-----------+----------+--------------+ FV Mid   Full                                                        +---------+---------------+---------+-----------+----------+--------------+ FV DistalFull                                                        +---------+---------------+---------+-----------+----------+--------------+ PFV      Full                                                        +---------+---------------+---------+-----------+----------+--------------+  POP      Full           Yes      Yes                                 +---------+---------------+---------+-----------+----------+--------------+ PTV      Full                                                        +---------+---------------+---------+-----------+----------+--------------+ PERO     Full                                                        +---------+---------------+---------+-----------+----------+--------------+     Summary: RIGHT: - There is no evidence of deep vein thrombosis in the lower extremity.  - No cystic structure found in  the popliteal fossa.  LEFT: - There is no evidence of deep vein thrombosis in the lower extremity.  - No cystic structure found in the popliteal fossa.  *See table(s) above for measurements and observations. Electronically signed by Monica Martinez MD on 08/01/2021 at 3:34:45 PM.    Final     ASSESSMENT & PLAN:   75 year old male with history of hypertension, prediabetes, dyslipidemia admitted  1) Recently diagnosed bilateral small subsegmental lower lobe Pulmonary Emboli.  Overall low clot burden with no right heart strain. Discussed imaging findings of patient's reported bilateral subsegmental PEs.  These were incidentally noted though he is reported palpitations and exertional dyspnea could potentially be related to this. CTA of the chest did not show significant obstructive CAD. The timeline of the symptoms is not very well-defined to know exactly when this event might have occurred. -Patient does not have any obvious reported provoking events. -Discussed potential etiologies for VTE. -Discussed that 80% of the PE is coming from the lower extremity venous circulation.  Would recommend repeating bilateral lower extremity venous ultrasounds to evaluate for DVT. -Discussed pros and cons of getting a hypercoagulable work-up in the absence of an obvious provoking event.  Patient is agreeable to this.  He does not have an obvious family history of hypercoagulable state.  He previously has not had any personal history of VTE. PLAN --Hypercoagulability work-up discussed in detail with the patient.  No overt findings suggestive of her primary hypercoagulable state. -Ultrasound bilateral lower extremities done on 08/01/2021 showed no evidence of DVT in bilateral lower extremities. -Continue Eliquis at this time.  Given that she does not have an obvious provoking factor might need to consider long-term anticoagulation.  -Discussed recommendations to reduce the risk of venous thromboembolism including   keeping well-hydrated, staying active, considering use of lower extremity compression socks especially while traveling etc. . No orders of the defined types were placed in this encounter.  Follow-up RTC with Dr Irene Limbo with labs in 4 months   All of the patients questions were answered with apparent satisfaction. The patient knows to call the clinic with any problems, questions or concerns.  . The total time spent in the appointment was 15 minutes and more than 50% was on counseling and direct patient cares.  Sullivan Lone MD [place] [facility] Hematology/Oncology Physician [facility]

## 2021-09-03 ENCOUNTER — Telehealth: Payer: Self-pay | Admitting: *Deleted

## 2021-09-03 DIAGNOSIS — I77810 Thoracic aortic ectasia: Secondary | ICD-10-CM

## 2021-09-03 NOTE — Telephone Encounter (Signed)
°    F.u aorta CT in one year and  with f/u with me at that time.  Focus on good BP control and avoid heavy straining.     JV   I spoke with patient and gave him information from Dr Irish Lack.

## 2021-10-28 DIAGNOSIS — I2699 Other pulmonary embolism without acute cor pulmonale: Secondary | ICD-10-CM | POA: Diagnosis not present

## 2021-10-28 DIAGNOSIS — E1169 Type 2 diabetes mellitus with other specified complication: Secondary | ICD-10-CM | POA: Diagnosis not present

## 2021-10-28 DIAGNOSIS — D696 Thrombocytopenia, unspecified: Secondary | ICD-10-CM | POA: Diagnosis not present

## 2021-10-28 DIAGNOSIS — Z0000 Encounter for general adult medical examination without abnormal findings: Secondary | ICD-10-CM | POA: Diagnosis not present

## 2021-10-28 DIAGNOSIS — J309 Allergic rhinitis, unspecified: Secondary | ICD-10-CM | POA: Diagnosis not present

## 2021-10-28 DIAGNOSIS — J449 Chronic obstructive pulmonary disease, unspecified: Secondary | ICD-10-CM | POA: Diagnosis not present

## 2021-10-28 DIAGNOSIS — E782 Mixed hyperlipidemia: Secondary | ICD-10-CM | POA: Diagnosis not present

## 2021-10-28 DIAGNOSIS — Z1389 Encounter for screening for other disorder: Secondary | ICD-10-CM | POA: Diagnosis not present

## 2021-10-28 DIAGNOSIS — Z1211 Encounter for screening for malignant neoplasm of colon: Secondary | ICD-10-CM | POA: Diagnosis not present

## 2021-10-28 DIAGNOSIS — I10 Essential (primary) hypertension: Secondary | ICD-10-CM | POA: Diagnosis not present

## 2021-12-03 ENCOUNTER — Other Ambulatory Visit: Payer: Self-pay

## 2021-12-03 DIAGNOSIS — I2694 Multiple subsegmental pulmonary emboli without acute cor pulmonale: Secondary | ICD-10-CM

## 2021-12-04 ENCOUNTER — Inpatient Hospital Stay: Payer: Medicare Other | Attending: Hematology

## 2021-12-04 ENCOUNTER — Other Ambulatory Visit: Payer: Self-pay

## 2021-12-04 ENCOUNTER — Inpatient Hospital Stay (HOSPITAL_BASED_OUTPATIENT_CLINIC_OR_DEPARTMENT_OTHER): Payer: Medicare Other | Admitting: Hematology

## 2021-12-04 VITALS — BP 138/69 | HR 62 | Temp 97.0°F | Resp 20 | Wt 205.0 lb

## 2021-12-04 DIAGNOSIS — Z7901 Long term (current) use of anticoagulants: Secondary | ICD-10-CM | POA: Diagnosis not present

## 2021-12-04 DIAGNOSIS — I2694 Multiple subsegmental pulmonary emboli without acute cor pulmonale: Secondary | ICD-10-CM | POA: Insufficient documentation

## 2021-12-04 DIAGNOSIS — Z79899 Other long term (current) drug therapy: Secondary | ICD-10-CM | POA: Insufficient documentation

## 2021-12-04 LAB — CBC WITH DIFFERENTIAL (CANCER CENTER ONLY)
Abs Immature Granulocytes: 0 10*3/uL (ref 0.00–0.07)
Basophils Absolute: 0 10*3/uL (ref 0.0–0.1)
Basophils Relative: 1 %
Eosinophils Absolute: 0 10*3/uL (ref 0.0–0.5)
Eosinophils Relative: 1 %
HCT: 44.3 % (ref 39.0–52.0)
Hemoglobin: 14.2 g/dL (ref 13.0–17.0)
Immature Granulocytes: 0 %
Lymphocytes Relative: 42 %
Lymphs Abs: 1.9 10*3/uL (ref 0.7–4.0)
MCH: 29.9 pg (ref 26.0–34.0)
MCHC: 32.1 g/dL (ref 30.0–36.0)
MCV: 93.3 fL (ref 80.0–100.0)
Monocytes Absolute: 0.6 10*3/uL (ref 0.1–1.0)
Monocytes Relative: 14 %
Neutro Abs: 1.8 10*3/uL (ref 1.7–7.7)
Neutrophils Relative %: 42 %
Platelet Count: 142 10*3/uL — ABNORMAL LOW (ref 150–400)
RBC: 4.75 MIL/uL (ref 4.22–5.81)
RDW: 13.5 % (ref 11.5–15.5)
WBC Count: 4.3 10*3/uL (ref 4.0–10.5)
nRBC: 0 % (ref 0.0–0.2)

## 2021-12-04 LAB — CMP (CANCER CENTER ONLY)
ALT: 26 U/L (ref 0–44)
AST: 29 U/L (ref 15–41)
Albumin: 3.8 g/dL (ref 3.5–5.0)
Alkaline Phosphatase: 61 U/L (ref 38–126)
Anion gap: 3 — ABNORMAL LOW (ref 5–15)
BUN: 18 mg/dL (ref 8–23)
CO2: 34 mmol/L — ABNORMAL HIGH (ref 22–32)
Calcium: 9.6 mg/dL (ref 8.9–10.3)
Chloride: 104 mmol/L (ref 98–111)
Creatinine: 1.56 mg/dL — ABNORMAL HIGH (ref 0.61–1.24)
GFR, Estimated: 46 mL/min — ABNORMAL LOW (ref >60)
Glucose, Bld: 95 mg/dL (ref 70–99)
Potassium: 4.3 mmol/L (ref 3.5–5.1)
Sodium: 141 mmol/L (ref 135–145)
Total Bilirubin: 0.6 mg/dL (ref 0.3–1.2)
Total Protein: 7 g/dL (ref 6.5–8.1)

## 2021-12-04 MED ORDER — APIXABAN 5 MG PO TABS: 5.0000 mg | ORAL_TABLET | Freq: Two times a day (BID) | ORAL | 2 refills | Status: DC

## 2021-12-10 NOTE — Progress Notes (Signed)
. ? ? ?HEMATOLOGY/ONCOLOGY CLINIC VISIT NOTE ? ?Date of Service: 12/04/2021 ? ? ?Patient Care Team: ?Antony Contras, MD as PCP - General (Family Medicine) ?Jettie Booze, MD as PCP - Cardiology (Cardiology) ? ?CHIEF COMPLAINTS/PURPOSE OF CONSULTATION:  ?Follow-up for management of pulm embolism ? ?HISTORY OF PRESENTING ILLNESS:  ? ?Chris Moore is a wonderful 76 y.o. male who has been referred to [place] by Dr .Antony Contras, MD ? for evaluation and management of Pulmonary embolism. ? ?History of hypertension, dyslipidemia and had a CT coronary angiogram but this cardiologist Dr. Beau Fanny on 07/10/2021 for evaluation of exertional dyspnea.  This showed minimal mixed nonobstructive CAD and was incidentally noted to have bilateral subsegmental pulmonary artery filling defects concerning for PE. ? ?Patient subsequently had a CTA of the chest to evaluate PE on the same day which showed bilateral small lower lobe acute pulmonary emboli. Overall clot burden is mild. No RIGHT ventricular strain. 4 mm right solid pulmonary nodule.  ? ?Patient was started on Eliquis and referred to [place] for further evaluation. ? ?Reports that he has had some palpitations and exertional dyspnea for about a month or 2.  No chest pain. ?No new leg swelling, pain redness or increased warmth. ?No recent fevers chills night sweats.  No definitive symptoms suspicious for malignancy. ? ?Patient reports he is up to speed with age-appropriate cancer screening with his primary care physician.  No new bowel or urinary symptoms.  No unexpected weight loss. ? ?Patient reports no reason for extended immobility.  No recent long distance travel.  No recent surgeries. ?Remote history of smoking smoked since age 59yr ?Denies excessive alcohol use. ?.Body mass index is 27.8 kg/m?.Marland Kitchen?Denies Hormonal use such as testosterone replacement. ?no recent COVID-19 infection ? ?Patient is tolerating his anticoagulation well and feels that the shortness of breath  has gotten somewhat better. ? ?INTERVAL HISTORY ? ?Patient is here for his scheduled 446-monthollow-up office pulmonary embolism. ?He notes no acute new symptoms since his last clinic visit. ?No chest pain or overt shortness of breath or dyspnea on exertion. ?Tolerating anticoagulation well without any bleeding issues. ?Labs today reviewed with the patient. ? ?MEDICAL HISTORY:  ?Past Medical History:  ?Diagnosis Date  ? Allergy   ? Arthritis   ? Hypercholesteremia   ? Hypertension   ? Internal hemorrhoids   ? Prediabetes   ? ? ?SURGICAL HISTORY: ?Past Surgical History:  ?Procedure Laterality Date  ? COLONOSCOPY    ? left knee arthroscopic surgery    ? POLYPECTOMY    ? ? ?SOCIAL HISTORY: ?Social History  ? ?Socioeconomic History  ? Marital status: Married  ?  Spouse name: Not on file  ? Number of children: Not on file  ? Years of education: Not on file  ? Highest education level: Not on file  ?Occupational History  ? Not on file  ?Tobacco Use  ? Smoking status: Former  ?  Types: Cigarettes  ? Smokeless tobacco: Never  ? Tobacco comments:  ?  quit at age 76?Vaping Use  ? Vaping Use: Never used  ?Substance and Sexual Activity  ? Alcohol use: Yes  ?  Alcohol/week: 0.0 standard drinks  ?  Comment: socially  ? Drug use: No  ? Sexual activity: Not on file  ?Other Topics Concern  ? Not on file  ?Social History Narrative  ? Not on file  ? ?Social Determinants of Health  ? ?Financial Resource Strain: Not on file  ?Food Insecurity: Not on  file  ?Transportation Needs: Not on file  ?Physical Activity: Not on file  ?Stress: Not on file  ?Social Connections: Not on file  ?Intimate Partner Violence: Not on file  ? ? ?FAMILY HISTORY: ?Family History  ?Problem Relation Age of Onset  ? Hypertension Mother   ? Diabetes Mother   ? Hyperlipidemia Mother   ? Hypertension Father   ? Colon polyps Neg Hx   ? Colon cancer Neg Hx   ? Esophageal cancer Neg Hx   ? Rectal cancer Neg Hx   ? Stomach cancer Neg Hx   ? ? ?ALLERGIES:  has No Known  Allergies. ? ?MEDICATIONS:  ?Current Outpatient Medications  ?Medication Sig Dispense Refill  ? acetaminophen (TYLENOL) 500 MG tablet Take 1,000 mg by mouth every 6 (six) hours as needed for mild pain, moderate pain, fever or headache.    ? apixaban (ELIQUIS) 5 MG TABS tablet Take 1 tablet (5 mg total) by mouth 2 (two) times daily. 60 tablet 2  ? atorvastatin (LIPITOR) 40 MG tablet Take 40 mg by mouth at bedtime.     ? fexofenadine (ALLEGRA) 60 MG tablet Take 1 tablet (60 mg total) by mouth 2 (two) times daily. 60 tablet 0  ? fluticasone (FLONASE) 50 MCG/ACT nasal spray Place 2 sprays into both nostrils daily. 16 g 0  ? losartan-hydrochlorothiazide (HYZAAR) 50-12.5 MG tablet Take 1 tablet by mouth daily.    ? benzonatate (TESSALON) 100 MG capsule Take 1 capsule (100 mg total) by mouth at bedtime as needed for cough. (Patient not taking: Reported on 12/04/2021) 15 capsule 0  ? diphenhydrAMINE (BENADRYL) 25 MG tablet Take 25 mg by mouth at bedtime as needed.    ? guaiFENesin-dextromethorphan (ROBITUSSIN DM) 100-10 MG/5ML syrup Take 5 mLs by mouth every 4 (four) hours as needed for cough (and congestion). 118 mL 0  ? metoprolol tartrate (LOPRESSOR) 100 MG tablet Take one tablet by mouth 2 hours prior to CT Scan (Patient not taking: Reported on 07/25/2021) 1 tablet 0  ? Phenylephrine-APAP-guaiFENesin 10-650-400 MG/20ML LIQD Take 20 mLs by mouth every 4 (four) hours as needed (for cough/congestion). (Patient not taking: Reported on 07/25/2021)    ? ?Current Facility-Administered Medications  ?Medication Dose Route Frequency Provider Last Rate Last Admin  ? 0.9 %  sodium chloride infusion  500 mL Intravenous Continuous Irene Shipper, MD      ? ? ?REVIEW OF SYSTEMS:   ?No new focal symptoms suggestive of malignancy. ?No new symptoms related to his PE.  No new leg swelling redness or pain. ?10 Point review of Systems was done is negative except as noted above. ? ? ?PHYSICAL EXAMINATION:  ?.BP 138/69   Pulse 62   Temp (!) 97  ?F (36.1 ?C)   Resp 20   Wt 205 lb (93 kg)   SpO2 100%   BMI 27.80 kg/m?  ?NAD ?GENERAL:alert, in no acute distress and comfortable ?SKIN: no acute rashes, no significant lesions ?EYES: conjunctiva are pink and non-injected, sclera anicteric ?OROPHARYNX: MMM, no exudates, no oropharyngeal erythema or ulceration ?NECK: supple, no JVD ?LYMPH:  no palpable lymphadenopathy in the cervical, axillary or inguinal regions ?LUNGS: clear to auscultation b/l with normal respiratory effort ?HEART: regular rate & rhythm ?ABDOMEN:  normoactive bowel sounds , non tender, not distended. ?Extremity: no pedal edema ?PSYCH: alert & oriented x 3 with fluent speech ?NEURO: no focal motor/sensory deficits ? ? ?LABORATORY DATA:  ?I have reviewed the data as listed ? ?. ?CBC Latest Ref Rng &  Units 12/04/2021 07/25/2021 07/17/2021  ?WBC 4.0 - 10.5 K/uL 4.3 3.2(L) 4.6  ?Hemoglobin 13.0 - 17.0 g/dL 14.2 14.1 14.6  ?Hematocrit 39.0 - 52.0 % 44.3 44.7 44.6  ?Platelets 150 - 400 K/uL 142(L) 154 169  ? ? ?. ?CMP Latest Ref Rng & Units 12/04/2021 07/25/2021 07/17/2021  ?Glucose 70 - 99 mg/dL 95 112(H) 77  ?BUN 8 - 23 mg/dL '18 17 16  '$ ?Creatinine 0.61 - 1.24 mg/dL 1.56(H) 1.41(H) 1.30(H)  ?Sodium 135 - 145 mmol/L 141 140 141  ?Potassium 3.5 - 5.1 mmol/L 4.3 4.3 4.7  ?Chloride 98 - 111 mmol/L 104 105 103  ?CO2 22 - 32 mmol/L 34(H) 27 29  ?Calcium 8.9 - 10.3 mg/dL 9.6 8.9 9.1  ?Total Protein 6.5 - 8.1 g/dL 7.0 7.1 -  ?Total Bilirubin 0.3 - 1.2 mg/dL 0.6 0.6 -  ?Alkaline Phos 38 - 126 U/L 61 62 -  ?AST 15 - 41 U/L 29 27 -  ?ALT 0 - 44 U/L 26 20 -  ? ? ? ?RADIOGRAPHIC STUDIES: ?I have personally reviewed the radiological images as listed and agreed with the findings in the report. ?No results found. ? ?ASSESSMENT & PLAN:  ? ?76 year old male with history of hypertension, prediabetes, dyslipidemia admitted ? ?1) Recently diagnosed bilateral small subsegmental lower lobe Pulmonary Emboli.  Overall low clot burden with no right heart strain. ?Discussed  imaging findings of patient's reported bilateral subsegmental PEs.  These were incidentally noted though he is reported palpitations and exertional dyspnea could potentially be related to this. ?CTA of the chest

## 2022-04-29 DIAGNOSIS — I10 Essential (primary) hypertension: Secondary | ICD-10-CM | POA: Diagnosis not present

## 2022-04-29 DIAGNOSIS — J449 Chronic obstructive pulmonary disease, unspecified: Secondary | ICD-10-CM | POA: Diagnosis not present

## 2022-04-29 DIAGNOSIS — J309 Allergic rhinitis, unspecified: Secondary | ICD-10-CM | POA: Diagnosis not present

## 2022-04-29 DIAGNOSIS — E782 Mixed hyperlipidemia: Secondary | ICD-10-CM | POA: Diagnosis not present

## 2022-04-29 DIAGNOSIS — I2699 Other pulmonary embolism without acute cor pulmonale: Secondary | ICD-10-CM | POA: Diagnosis not present

## 2022-04-29 DIAGNOSIS — E1169 Type 2 diabetes mellitus with other specified complication: Secondary | ICD-10-CM | POA: Diagnosis not present

## 2022-04-29 DIAGNOSIS — D696 Thrombocytopenia, unspecified: Secondary | ICD-10-CM | POA: Diagnosis not present

## 2022-05-02 ENCOUNTER — Encounter: Payer: Self-pay | Admitting: Internal Medicine

## 2022-06-02 ENCOUNTER — Other Ambulatory Visit: Payer: Self-pay

## 2022-06-02 DIAGNOSIS — Z01812 Encounter for preprocedural laboratory examination: Secondary | ICD-10-CM

## 2022-06-02 DIAGNOSIS — I77810 Thoracic aortic ectasia: Secondary | ICD-10-CM

## 2022-06-02 DIAGNOSIS — Z86711 Personal history of pulmonary embolism: Secondary | ICD-10-CM

## 2022-06-02 NOTE — Progress Notes (Signed)
Pt is scheduled for CT of Aorta and will need BMET prir to scan.  Order placed.

## 2022-06-12 IMAGING — CT CT HEART MORP W/ CTA COR W/ SCORE W/ CA W/CM &/OR W/O CM
1 series · 3 of 5 positions shown, 4 images · IV contrast (omnipaque)
Comparison: None.
COMPARISON: None.

Addendum:
EXAM:
OVER-READ INTERPRETATION  CT CHEST

The following report is an over-read performed by radiologist Dr.
Kevyn Del Pino [[email or web address]] on 07/10/2021. This
over-read does not include interpretation of cardiac or coronary
anatomy or pathology. The coronary calcium score/coronary CTA
interpretation by the cardiologist is attached.
HISTORY: 75 yo male with dyspnea on exertion (BEECKMAN)
Cardiac/Coronary CTA
TECHNIQUE: The patient was scanned on a Siemens Force scanner.
PROTOCOL: A 110 kV prospective scan was triggered in the descending thoracic
aorta at 111 HU's. Axial non-contrast 3 mm slices were carried out
through the heart. The data set was analyzed on a dedicated work
station and scored using the Agatson method. Gantry rotation speed
was 250 msecs and collimation was .6 mm. Beta blockade and 0.8 mg of
sl NTG was given. The 3D data set was reconstructed in 10% intervals
of the 0-90 % of the R-R cycle. Diastolic phases were analyzed on a
dedicated work station using MPR, MIP and VRT modes. The patient
received 95mL OMNIPAQUE IOHEXOL 350 MG/ML SOLN of contrast.

[Series 2967: aorta and coronaries · 3 of 5 slices shown, 4 images]
[im 2/5  vessel]
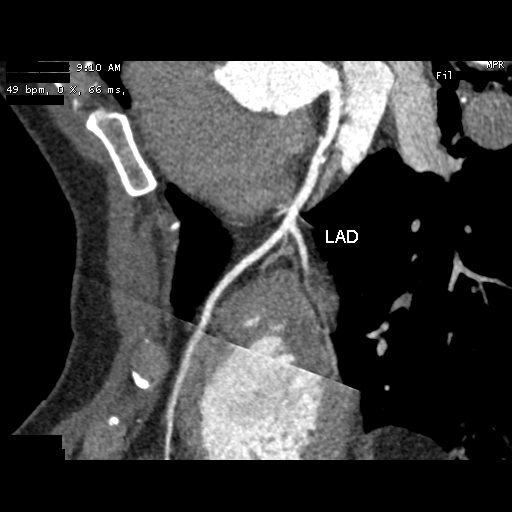
[im 2/5  lung]
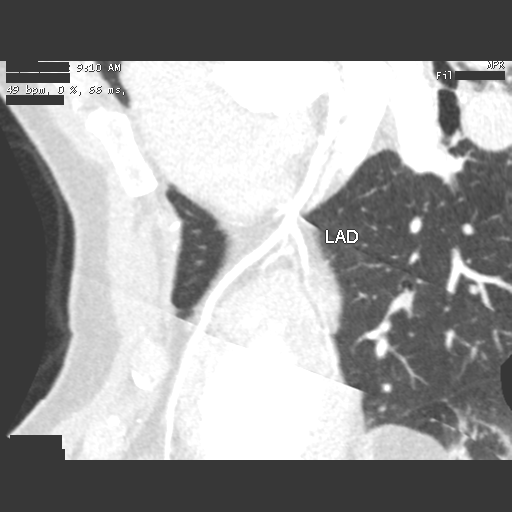
[im 3/5  vessel]
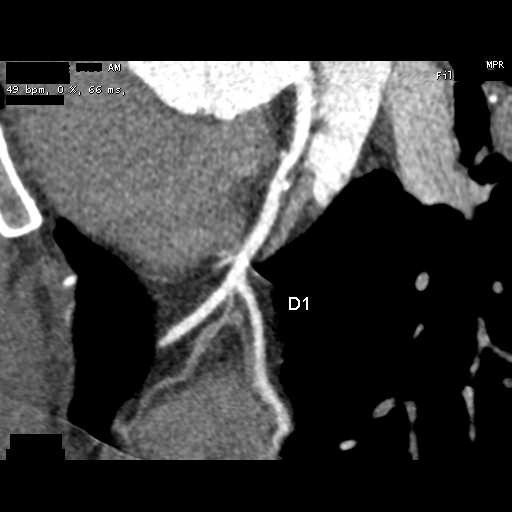
[im 4/5  vessel]
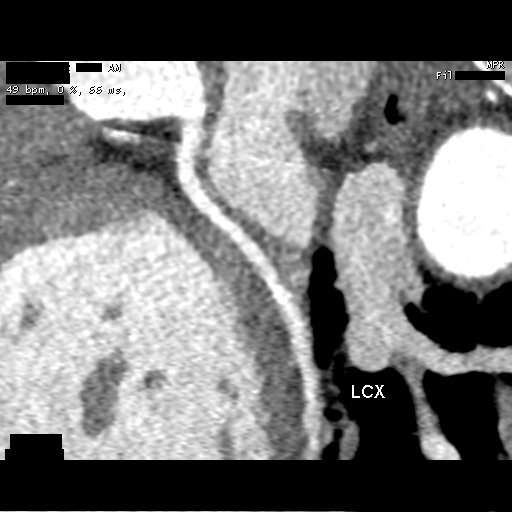

[3 of 5 positions shown; findings below may reference images not displayed]

FINDINGS: Vascular: Venous mixing artifact bilaterally. There may be filling
defects, however, in the lower lobe segmental and subsegmental
pulmonary arteries. Ascending aorta measures 4.2 cm.

Mediastinum/Nodes: No pathologically enlarged lymph nodes. Esophagus
is grossly unremarkable.

Lungs/Pleura: Image quality is degraded by respiratory motion. No
suspicious pulmonary nodules. No pleural fluid. Airway is
unremarkable.

Upper Abdomen: None.

Musculoskeletal: Mild degenerative changes in the spine.
IMPRESSION: 1. Although this study was not optimized for the detection of
pulmonary emboli, there are findings suspicious for segmental and
subsegmental pulmonary emboli in the lower lobes. CT angiography of
the chest with contrast to evaluate for pulmonary emboli is
suggested. These results will be called to the ordering clinician or
representative by the Radiologist Assistant, and communication
documented in the PACS or [REDACTED].
2. Ascending aortic aneurysm. Recommend annual imaging followup by
CTA or MRA. This recommendation follows 6838
ACCF/AHA/AATS/ACR/ASA/SCA/FERIENHAUS/BUD/SGGEVDU/KIT LAM Guidelines for the
Diagnosis and Management of Patients with Thoracic Aortic Disease.
Circulation. 6838; 121: E266-e369. Aortic aneurysm NOS
(JO6HS-J0S.4).
FINDINGS: Quality: Fair to poor, significant mis-registration artifact, motion
artifact, HR 51

Coronary calcium score: The patient's coronary artery calcium score
is 31.3, which places the patient in the 40th percentile.

Coronary arteries: Normal coronary origins.  Right dominance.

Right Coronary Artery: Notwithstanding mid-vessel artifact, there is
no significant CAD.

Left Main Coronary Artery: Normal. Bifurcates into the LAD and LCx
arteries.

Left Anterior Descending Coronary Artery: Anterior vessel which
reaches the apex. Minimal mixed proximal stenosis 1-24% ([id number]).
Single mid-vessel diagonal branch without disease.

Left Circumflex Artery: AV groove vessel. No significant disease.
Several OM branches without disease.

Aorta: Dilated to 42 mm at the mid ascending aorta (level of the PA
bifurcation) measured double oblique. No calcifications. No
dissection.

Aortic Valve: Trileaflet. No calcifications.

Other findings:

Normal pulmonary vein drainage into the left atrium.

Normal left atrial appendage without a thrombus.

Normal size of the pulmonary artery.
IMPRESSION: 1. Minimal mixed non-obstructive CAD, CADRADS = 1.

2. Coronary calcium score of 31.3. This was 40th percentile for age
and sex matched control.

3. Normal coronary origin with right dominance.

4. Aorta: Dilated to 42 mm at the mid ascending aorta (level of the
PA bifurcation) measured double oblique. No calcifications. No
dissection.

5. See radiology over-read about bilateral subsegmental pulmonary
artery filling defects concerning for pulmonary emboli

*** End of Addendum ***
EXAM:
OVER-READ INTERPRETATION  CT CHEST

The following report is an over-read performed by radiologist Dr.
Kevyn Del Pino [REDACTED] on 07/10/2021. This
over-read does not include interpretation of cardiac or coronary
anatomy or pathology. The coronary calcium score/coronary CTA
interpretation by the cardiologist is attached.
FINDINGS: Vascular: Venous mixing artifact bilaterally. There may be filling
defects, however, in the lower lobe segmental and subsegmental
pulmonary arteries. Ascending aorta measures 4.2 cm.

Mediastinum/Nodes: No pathologically enlarged lymph nodes. Esophagus
is grossly unremarkable.

Lungs/Pleura: Image quality is degraded by respiratory motion. No
suspicious pulmonary nodules. No pleural fluid. Airway is
unremarkable.

Upper Abdomen: None.

Musculoskeletal: Mild degenerative changes in the spine.
IMPRESSION: 1. Although this study was not optimized for the detection of
pulmonary emboli, there are findings suspicious for segmental and
subsegmental pulmonary emboli in the lower lobes. CT angiography of
the chest with contrast to evaluate for pulmonary emboli is
suggested. These results will be called to the ordering clinician or
representative by the Radiologist Assistant, and communication
documented in the PACS or [REDACTED].
2. Ascending aortic aneurysm. Recommend annual imaging followup by
CTA or MRA. This recommendation follows 6838
ACCF/AHA/AATS/ACR/ASA/SCA/FERIENHAUS/BUD/SGGEVDU/KIT LAM Guidelines for the
Diagnosis and Management of Patients with Thoracic Aortic Disease.
Circulation. 6838; 121: [id number]. Aortic aneurysm NOS
(JO6HS-J0S.4).

## 2022-06-16 ENCOUNTER — Ambulatory Visit: Payer: Medicare Other | Admitting: Internal Medicine

## 2022-06-19 ENCOUNTER — Ambulatory Visit: Payer: Medicare Other | Attending: Cardiovascular Disease

## 2022-06-19 DIAGNOSIS — Z01812 Encounter for preprocedural laboratory examination: Secondary | ICD-10-CM | POA: Diagnosis not present

## 2022-06-19 DIAGNOSIS — I77810 Thoracic aortic ectasia: Secondary | ICD-10-CM | POA: Diagnosis not present

## 2022-06-19 DIAGNOSIS — Z86711 Personal history of pulmonary embolism: Secondary | ICD-10-CM

## 2022-06-19 LAB — BASIC METABOLIC PANEL
BUN/Creatinine Ratio: 11 (ref 10–24)
BUN: 16 mg/dL (ref 8–27)
CO2: 32 mmol/L — ABNORMAL HIGH (ref 20–29)
Calcium: 9.2 mg/dL (ref 8.6–10.2)
Chloride: 103 mmol/L (ref 96–106)
Creatinine, Ser: 1.4 mg/dL — ABNORMAL HIGH (ref 0.76–1.27)
Glucose: 110 mg/dL — ABNORMAL HIGH (ref 70–99)
Potassium: 4.6 mmol/L (ref 3.5–5.2)
Sodium: 140 mmol/L (ref 134–144)
eGFR: 52 mL/min/{1.73_m2} — ABNORMAL LOW (ref >59)

## 2022-06-24 ENCOUNTER — Inpatient Hospital Stay: Admission: RE | Admit: 2022-06-24 | Payer: Medicare Other | Source: Ambulatory Visit

## 2022-06-24 ENCOUNTER — Encounter (HOSPITAL_BASED_OUTPATIENT_CLINIC_OR_DEPARTMENT_OTHER): Payer: Self-pay

## 2022-06-24 ENCOUNTER — Ambulatory Visit (HOSPITAL_BASED_OUTPATIENT_CLINIC_OR_DEPARTMENT_OTHER)
Admission: RE | Admit: 2022-06-24 | Discharge: 2022-06-24 | Disposition: A | Discharge status: Home / Self Care | Payer: Medicare Other | Source: Ambulatory Visit | Attending: Interventional Cardiology | Admitting: Interventional Cardiology

## 2022-06-24 DIAGNOSIS — I700 Atherosclerosis of aorta: Secondary | ICD-10-CM | POA: Diagnosis not present

## 2022-06-24 DIAGNOSIS — R911 Solitary pulmonary nodule: Secondary | ICD-10-CM | POA: Diagnosis not present

## 2022-06-24 DIAGNOSIS — I77810 Thoracic aortic ectasia: Secondary | ICD-10-CM | POA: Diagnosis not present

## 2022-06-24 MED ORDER — IOHEXOL 350 MG/ML SOLN
100.0000 mL | Freq: Once | INTRAVENOUS | Status: AC | PRN
  Administered 2022-06-24: 75 mL via INTRAVENOUS

## 2022-06-27 ENCOUNTER — Encounter: Payer: Self-pay | Admitting: Internal Medicine

## 2022-06-27 ENCOUNTER — Telehealth: Payer: Self-pay

## 2022-06-27 ENCOUNTER — Ambulatory Visit: Payer: Medicare Other | Admitting: Internal Medicine

## 2022-06-27 VITALS — BP 130/86 | HR 70 | Ht 72.0 in | Wt 198.2 lb

## 2022-06-27 DIAGNOSIS — Z86010 Personal history of colonic polyps: Secondary | ICD-10-CM

## 2022-06-27 DIAGNOSIS — Z7901 Long term (current) use of anticoagulants: Secondary | ICD-10-CM

## 2022-06-27 MED ORDER — NA SULFATE-K SULFATE-MG SULF 17.5-3.13-1.6 GM/177ML PO SOLN: 1.0000 | Freq: Once | ORAL | 0 refills | Status: AC

## 2022-06-27 NOTE — Progress Notes (Signed)
HISTORY OF PRESENT ILLNESS:   Chris Moore is a 76 y.o. male with past medical history as listed below who presents today regarding surveillance colonoscopy due to history of adenomatous colon polyps.  Previous colonoscopies 2002, 2008, 2018.   The patient has a history of unprovoked DVT with pulmonary embolism and negative hypercoagulability work-up for which she has been on Eliquis for 1 year.  Last seen by his hematologist March 2023.  Has upcoming appointment with his cardiologist.  Oncology had recommended staying on Eliquis long-term.  Patient presents to the office today to address his anticoagulation preprocedure.   The patient's GI review of systems is negative except for occasional issues with hemorrhoids.  Repeat blood work from December 04, 2021 shows unremarkable comprehensive metabolic panel except for creatinine 1.56.  Unremarkable CBC with hemoglobin 14.2.   REVIEW OF SYSTEMS:   All non-GI ROS negative unless otherwise stated in the HPI except for arthritis       Past Medical History:  Diagnosis Date   Allergy     Arthritis     Hypercholesteremia     Hypertension     Internal hemorrhoids     Prediabetes             Past Surgical History:  Procedure Laterality Date   COLONOSCOPY       HERNIA REPAIR       left knee arthroscopic surgery       POLYPECTOMY          Social History Chris Moore  reports that he has quit smoking. His smoking use included cigarettes. He has never used smokeless tobacco. He reports current alcohol use. He reports that he does not use drugs.   family history includes Diabetes in his mother; Hyperlipidemia in his mother; Hypertension in his father and mother.   No Known Allergies       PHYSICAL EXAMINATION: Vital signs: BP 130/86   Pulse 70   Ht 6' (1.829 m)   Wt 198 lb 4 oz (89.9 kg)   BMI 26.89 kg/m   Constitutional: generally well-appearing, no acute distress Psychiatric: alert and oriented x3, cooperative Eyes:  extraocular movements intact, anicteric, conjunctiva pink Mouth: oral pharynx moist, no lesions Neck: supple no lymphadenopathy Cardiovascular: heart regular rate and rhythm, no murmur Lungs: clear to auscultation bilaterally Abdomen: soft, nontender, nondistended, no obvious ascites, no peritoneal signs, normal bowel sounds, no organomegaly Rectal: Deferred until colonoscopy Extremities: no clubbing, cyanosis, or lower extremity edema bilaterally Skin: no lesions on visible extremities Neuro: No focal deficits.  Cranial nerves intact   ASSESSMENT:   1.  History of adenomatous colon polyps.  Presents today for surveillance colonoscopy.  Appropriate candidate without contraindication 2.  History of unprovoked DVT with PE (after hernia repair) and subsequent negative hypercoagulability work-up.  Has been on Eliquis for 1 year     PLAN:   1.  Colonoscopy.  Patient is high risk given the need to address anticoagulation.The nature of the procedure, as well as the risks, benefits, and alternatives were carefully and thoroughly reviewed with the patient. Ample time for discussion and questions allowed. The patient understood, was satisfied, and agreed to proceed.  2.  Hold Eliquis the day prior to the procedure and the day of the procedure.  Would anticipate resumption of anticoagulation immediately post procedure.  I will check with his hematologist to see if this is acceptable       

## 2022-06-27 NOTE — Telephone Encounter (Signed)
 [facility] Pre-operative Risk Assessment     Request for surgical clearance:     Endoscopy Procedure  What type of surgery is being performed?     Colonoscopy  When is this surgery scheduled?     08/05/2022  What type of clearance is required ?   Pharmacy  Are there any medications that need to be held prior to surgery and how long? Eliquis - day before and day of procedure.  Practice name and name of physician performing surgery?      [facility]  What is your office phone and fax number?      Phone- [phone]  [phone]  Anesthesia type (None, local, MAC, general) ?       MAC

## 2022-06-27 NOTE — Patient Instructions (Signed)
_______________________________________________________  If you are age 75 or older, your body mass index should be between 23-30. Your Body mass index is 26.89 kg/m. If this is out of the aforementioned range listed, please consider follow up with your Primary Care Provider.  If you are age 55 or younger, your body mass index should be between 19-25. Your Body mass index is 26.89 kg/m. If this is out of the aformentioned range listed, please consider follow up with your Primary Care Provider.   ________________________________________________________  The [facility] providers would like to encourage you to use [facility] to communicate with providers for non-urgent requests or questions.  Due to long hold times on the telephone, sending your provider a message by [facility] may be a faster and more efficient way to get a response.  Please allow 48 business hours for a response.  Please remember that this is for non-urgent requests.  _______________________________________________________  Dennis Bast have been scheduled for a colonoscopy. Please follow written instructions given to you at your visit today.  Please pick up your prep supplies at the pharmacy within the next 1-3 days. If you use inhalers (even only as needed), please bring them with you on the day of your procedure.

## 2022-06-30 ENCOUNTER — Other Ambulatory Visit: Payer: Self-pay

## 2022-06-30 ENCOUNTER — Telehealth: Payer: Self-pay | Admitting: Interventional Cardiology

## 2022-06-30 MED ORDER — APIXABAN 5 MG PO TABS: 5.0000 mg | ORAL_TABLET | Freq: Two times a day (BID) | ORAL | 5 refills | Status: AC

## 2022-06-30 NOTE — Telephone Encounter (Signed)
Prescription refill request for Eliquis received. Indication:DVT Last office visit:10/22 Scr:1.4 Age: 76 Weight:89.9 kg  Prescription refilled

## 2022-06-30 NOTE — Telephone Encounter (Signed)
*  STAT* If patient is at the pharmacy, call can be transferred to refill team.   1. Which medications need to be refilled? (please list name of each medication and dose if known) apixaban (ELIQUIS) 5 MG TABS tablet  2. Which pharmacy/location (including street and city if local pharmacy) is medication to be sent to? [id number] - [address]. Do they need a 30 day or 90 day supply? [address]

## 2022-07-03 NOTE — Telephone Encounter (Signed)
Patient with diagnosis of PE on Eliquis for anticoagulation.    Procedure: colonoscopy Date of procedure: 08/05/22  CrCl 58 mLmin Platelet count 142K   Per office protocol, patient can hold Eliquis for 2 days prior to procedure and resume immediately after.  **This guidance is not considered finalized until pre-operative APP has relayed final recommendations.**

## 2022-07-03 NOTE — Telephone Encounter (Signed)
   Patient Name: Chris Moore  DOB: 03-25-1946 MRN: [id number]  Primary Cardiologist: Larae Grooms, MD  Chart reviewed as part of pre-operative protocol coverage. Pre-op clearance already addressed by colleagues in earlier phone notes. To summarize recommendations:  -Per our pharmacy team, Eliquis can be held for 2 days prior to the procedure.  Please restart medical safe to do so.  Medical clearance was not requested.  Will route this bundled recommendation to requesting provider via Epic fax function and remove from pre-op pool. Please call with questions.  Elgie Collard, PA-C 07/03/2022, 4:32 PM

## 2022-07-04 ENCOUNTER — Telehealth: Payer: Self-pay

## 2022-07-04 NOTE — Telephone Encounter (Signed)
Lm on vm that per Dr. Irish Lack, patient could hold his Eliquis for 2 days prior to his procedure.  Asked that he call back and leave me a message that he received this information.

## 2022-07-11 NOTE — Telephone Encounter (Signed)
Lm on vm with Eliquis information.  Requested a call back

## 2022-07-24 NOTE — Telephone Encounter (Signed)
Lm on vm and sent mychart message regarding Eliquis

## 2022-07-28 NOTE — Progress Notes (Unsigned)
Cardiology Clinic Note   Patient Name: Chris Moore Date of Encounter: 07/29/2022  Primary Care Provider:  Antony Contras, MD Primary Cardiologist:  Larae Grooms, MD  Patient Profile    Mr. 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  Past Medical History    Past Medical History:  Diagnosis Date   Allergy    Arthritis    Hypercholesteremia    Hypertension    Internal hemorrhoids    Prediabetes    Past Surgical History:  Procedure Laterality Date   COLONOSCOPY     HERNIA REPAIR     left knee arthroscopic surgery     POLYPECTOMY      Allergies  No Known Allergies  History of Present Illness    Mr. Savitz has a past medical history of: Nonobstructive CAD.  Coronary CT 07/10/2021: Minimal mixed nonobstructive CAD, calcium score 31.3 (40th percentile for age and sex).  Aorta dilated to 42 mm at the mid ascending aorta (level of the PA bifurcation) without calcifications or dissection.  Findings suspicious for segmental and subsegmental pulmonary emboli in the lower lobes. History of PE.  CTA chest (PE protocol) 07/10/2021: Bilateral small lower lobe acute pulmonary emboli.  Overall clot burden is mild.  No right ventricular strain.  4 mm solid nodule in the right lower lobe (no follow-up recommended) Hypertension.   Hypercholesterolemia.  Last lipid panel 04/29/2022: LDL 112, HDL 55, triglycerides 125, total cholesterol 189. Palpitations.   EKG 07/03/2021 showed PACs and PVCs. Aortic dilatation/aortic atherosclerosis.  Coronary CT 07/10/2021: Aorta dilated to 42 mm at the mid ascending aorta (level of the PA bifurcation) without calcifications or dissection. CTA aorta 06/24/2022: Stable mild dilatation of the ascending  aorta at 4.1 cm with no evidence of dissection.  Stable small linear filling defects in the lower lobe pulmonary arteries bilaterally likely the sequelae of remote pulmonary embolism.  No evidence of acute pulmonary embolism.  Stable small benign right lower lobe pulmonary nodule (no follow-up recommended).  Coronary and aortic atherosclerosis.  Patient was seen for new patient visit on 07/03/2021 by Dr. Irish Lack for palpitations.  Also complained of DOE at that time.  EKG showed PACs and PVCs.  Coronary CT was ordered showing minimal nonobstructive CAD, calcium score 31.3.  Incidental suspicion of PE that was confirmed with CTA chest.  Patient was started on Eliquis 5 mg twice a day.   Today, patient reports he is doing very well.  Discussed results of recent CTA.  Aortic aneurysm is stable at this time.  Reviewed restrictions and precautions related to the aneurysm.  Patient understands he will be on Eliquis long-term.  He admits he has occasionally forgotten the second dose of Eliquis.  Educated on the importance of taking medication.  Offered changing to once daily dosing medication such as Xarelto.  Patient declines at this time stating he would prefer to stay on the Eliquis.  He denies blood in his stool or urine or any other bleeding concerns.  He has an upcoming colonoscopy scheduled.  Reminded him per pharmacy team hold Eliquis for 2 days prior to colonoscopy.  Restart as directed by GI.  Patient reports he has noticed on the day he forgot his second dose of Eliquis that his Apple Watch alerted him to possible A-fib.  He did not feel as though he was having palpitations.  He states he notices right before he went to bed and by the time he woke up everything was "normal."  He denies shortness of breath.  He is very active golfing 18 holes 1-2 times a week weather permitting, performing yard work, and doing stretching exercises at home.  He denies DOE, chest pain, or lower extremity edema.  Blood  pressure is high today 148/88 at intake and 138/82 at recheck.  Patient reports BP tends to be elevated in the doctor's office.  He shares home BP log: 07/09/2022 128/72 07/10/2022 117/69 07/11/2022 121/72 07/14/2022 113/76 07/19/2022 125/82  Discussed CKD stage III with patient.  He reports he was made aware of this by Dr. Irish Lack.  Last creatinine on 06/11/2022 was stable at 1.4.  Given his CKD discussed the possibility of MRI for his aortic aneurysm check next year.  He does not have a history of welding or retained metal in his eye.  Offered referral to nephrology for further work-up.  He declines at this time opting for monitoring with his PCP.    Home Medications    Current Meds  Medication Sig   acetaminophen (TYLENOL) 500 MG tablet Take 1,000 mg by mouth every 6 (six) hours as needed for mild pain, moderate pain, fever or headache.   apixaban (ELIQUIS) 5 MG TABS tablet Take 1 tablet (5 mg total) by mouth 2 (two) times daily.   atorvastatin (LIPITOR) 40 MG tablet Take 40 mg by mouth at bedtime.    benzonatate (TESSALON) 100 MG capsule Take 1 capsule (100 mg total) by mouth at bedtime as needed for cough.   diphenhydrAMINE (BENADRYL) 25 MG tablet Take 25 mg by mouth at bedtime as needed.   fexofenadine (ALLEGRA) 180 MG tablet Take 180 mg by mouth daily.   fluticasone (FLONASE) 50 MCG/ACT nasal spray Place 2 sprays into both nostrils daily.   hydrochlorothiazide (HYDRODIURIL) 12.5 MG tablet Take 12.5 mg by mouth daily.   losartan (COZAAR) 100 MG tablet Take 100 mg by mouth daily.   [DISCONTINUED] fexofenadine (ALLEGRA) 60 MG tablet Take 1 tablet (60 mg total) by mouth 2 (two) times daily.   [DISCONTINUED] losartan-hydrochlorothiazide (HYZAAR) 50-12.5 MG tablet Take 1 tablet by mouth daily.    Family History    Family History  Problem Relation Age of Onset   Hypertension Mother    Diabetes Mother    Hyperlipidemia Mother    Hypertension Father    Colon polyps Neg Hx    Colon  cancer Neg Hx    Esophageal cancer Neg Hx    Rectal cancer Neg Hx    Stomach cancer Neg Hx    He indicated that his mother is alive. He indicated that his father is deceased. He indicated that his sister is alive. He indicated that both of his brothers are alive. He indicated that the status of his neg hx is unknown.   Social History    Social History   Socioeconomic History   Marital status: Married    Spouse name: Not on file   Number of children: 3   Years of education: Not on file   Highest education level: Not on file  Occupational History   Not on file  Tobacco Use   Smoking status: Former    Types: Cigarettes   Smokeless tobacco: Never   Tobacco comments:    quit at age 51  Vaping Use   Vaping Use: Never used  Substance and Sexual Activity   Alcohol use: Yes    Alcohol/week: 0.0 standard drinks of alcohol    Comment: socially   Drug use: No   Sexual activity: Not on file  Other Topics Concern   Not on file  Social History Narrative   Not on file   Social Determinants of Health   Financial Resource Strain: Not on file  Food Insecurity: Not on file  Transportation Needs: Not on file  Physical Activity: Not on file  Stress: Not on file  Social Connections: Not on file  Intimate Partner Violence: Not on file     Review of Systems    General: No chills, fever, night sweats or weight changes.  Cardiovascular:  No chest pain, dyspnea on exertion, edema, orthopnea, palpitations, paroxysmal nocturnal dyspnea. Dermatological: No rash, lesions/masses Respiratory: No cough, dyspnea Urologic: No hematuria, dysuria Abdominal:   No nausea, vomiting, diarrhea, bright red blood per rectum, melena, or hematemesis Neurologic:  No visual changes, weakness, changes in mental status. All other systems reviewed and are otherwise negative except as noted above.  Physical Exam    VS:  BP (!) 144/88   Pulse 63   Ht 6' (1.829 m)   Wt 202 lb 12.8 oz (92 kg)   SpO2 95%    BMI 27.50 kg/m  , BMI Body mass index is 27.5 kg/m. GEN: Well nourished, well developed, in no acute distress. HEENT: Normal. Neck: Supple, no JVD, carotid bruits, or masses. Cardiac: RRR, no murmurs, rubs, or gallops. No clubbing, cyanosis, edema.  Radials/DP/PT 2+ and equal bilaterally.  Respiratory:  Respirations regular and unlabored, clear to auscultation bilaterally. GI: Soft, nontender, nondistended. MS: No deformity or atrophy. Skin: Warm and dry, no rash. Neuro: Strength and sensation are intact. Psych: Normal affect.  Accessory Clinical Findings     Recent Labs: 12/04/2021: ALT 26; Hemoglobin 14.2; Platelet Count 142 06/19/2022: BUN 16; Creatinine, Ser 1.40; Potassium 4.6; Sodium 140   Recent Lipid Panel No results found for: "CHOL", "TRIG", "HDL", "CHOLHDL", "VLDL", "LDLCALC", "LDLDIRECT"    ECG personally reviewed by me today normal sinus rhythm, rate 63.  No ectopy.  No significant changes from 07/03/2022   Assessment & Plan   Aortic atherosclerosis, dilation of aorta.  CTA aorta 06/24/2022 showed stable mild dilatation of the ascending aorta without dissection.  Reviewed restrictions and precautions related to aortic aneurysm including good blood pressure control and lifting restrictions of 30 pounds.  Further instructions included in AVS.  We will follow-up with diagnostic MRA chest W/WO in October 2024 secondary to CKD stage III. CAD.  Coronary CT 07/10/2021 showed mixed nonobstructive CAD, calcium score 31.3.  Patient denies chest pain, shortness of breath, or DOE.  Patient is very active golfing 18 holes 1-2 times a week weather permitting, performing yard work, and doing stretching exercises at home.  He is working on following a [employer]. History of PE.  CTA ordered 06/24/2022 showed stable small linear filling defects in the lower lobe pulmonary arteries bilaterally likely the sequelae of remote pulmonary embolism.  No evidence of acute pulmonary embolism.  Per  Dr. Irene Limbo heme-onc patient will continue Eliquis 5 mg twice a day long-term.  Patient denies blood in stool or urine or any bleeding concerns at this time.  Hemoglobin 04/29/2022 13.5.  Dr. Irene Limbo previously recommended that he follow up with PCP for this. The patient will discuss management with his PCP going forward.  He was reassured that if there is any difficulty ensuring continuation  of his anticoagulation to reach out to our office to assist. Hypertension.  BP 144/88 at intake, 130/82 at recheck.  Patient reports BP is always high in doctor's office.  He produces home BP log showing blood pressures ranging from 117-128/69-82.  Heart rate is consistently in the 70s at home.  Currently on losartan 100 mg daily and HCTZ 12.5 mg daily.  Given blood pressure control and stable heart rate on this regimen will hold off on starting beta-blocker at this time. Hyperlipidemia.  Last lipid panel 04/29/2022 showed LDL of 112, not at goal.  Will increase atorvastatin to 80 mg nightly.  Repeat lipid panel and LFTs in 2 months. Palpitations.  Patient reports when he forgot his Eliquis dose his Apple Watch notified him of possible A-fib.  This was right before he went to bed and when he woke up in the morning he was "normal."  He denies feeling palpitations at that time.  This is only happened 1 time prior.  He is anticoagulated at this time on Eliquis.  He is instructed to contact the office if he receives more frequent alerts from his [place] or feels increased incidences of palpitations, in which case we would arrange Zio. CKD stage III.  Patient's last creatinine 06/19/2022 was stable at 1.4.  Offered referral to nephrology for further work-up.  Patient declines at this time opting for continued monitoring from PCP.  Discussed obtaining an MRI versus CT to evaluate aorta next year.  He is in agreement with this plan. He does not have a history of welding or retained metal in his eye.  Disposition: Increase atorvastatin  to 80 mg nightly.  Repeat lipid panel and LFTs in 2 months.  Return in 1 year with Dr. Irish Lack or sooner as needed.   [place]. Zoriana Oats, NP-C     07/29/2022, 9:39 AM [place] [address] Office [phone] Fax [phone]   I spent 17 minutes examining this patient, reviewing medications, and using patient centered shared decision making involving her cardiac care.  Prior to her visit I spent greater than 20 minutes reviewing her past medical history,  medications, and prior cardiac tests.

## 2022-07-29 ENCOUNTER — Ambulatory Visit: Payer: Medicare Other | Admitting: Physician Assistant

## 2022-07-29 ENCOUNTER — Encounter: Payer: Self-pay | Admitting: Student

## 2022-07-29 ENCOUNTER — Ambulatory Visit: Payer: Medicare Other | Attending: Physician Assistant | Admitting: Student

## 2022-07-29 ENCOUNTER — Encounter: Payer: Self-pay | Admitting: Internal Medicine

## 2022-07-29 VITALS — BP 138/82 | HR 63 | Ht 72.0 in | Wt 202.8 lb

## 2022-07-29 DIAGNOSIS — I2699 Other pulmonary embolism without acute cor pulmonale: Secondary | ICD-10-CM

## 2022-07-29 DIAGNOSIS — N1831 Chronic kidney disease, stage 3a: Secondary | ICD-10-CM

## 2022-07-29 DIAGNOSIS — R002 Palpitations: Secondary | ICD-10-CM

## 2022-07-29 DIAGNOSIS — I10 Essential (primary) hypertension: Secondary | ICD-10-CM

## 2022-07-29 DIAGNOSIS — E782 Mixed hyperlipidemia: Secondary | ICD-10-CM

## 2022-07-29 DIAGNOSIS — M25569 Pain in unspecified knee: Secondary | ICD-10-CM | POA: Insufficient documentation

## 2022-07-29 DIAGNOSIS — E785 Hyperlipidemia, unspecified: Secondary | ICD-10-CM | POA: Insufficient documentation

## 2022-07-29 DIAGNOSIS — I2510 Atherosclerotic heart disease of native coronary artery without angina pectoris: Secondary | ICD-10-CM

## 2022-07-29 DIAGNOSIS — I700 Atherosclerosis of aorta: Secondary | ICD-10-CM

## 2022-07-29 MED ORDER — ATORVASTATIN CALCIUM 80 MG PO TABS: 80.0000 mg | ORAL_TABLET | Freq: Every day | ORAL | 1 refills | Status: DC

## 2022-07-29 NOTE — Patient Instructions (Addendum)
Medication Instructions:    START TAKING : ATORVASTATIN 80 MG ONCE  A DAY    ( REMEMBER TO HOLD ELIQUIS 2 DAYS BEFORE COLONOSCOPY)    *If you need a refill on your cardiac medications before your next appointment, please call your pharmacy*   Lab Work: RETURN IN 2 MONTHS LFT AND LIPIDS    If you have labs (blood work) drawn today and your tests are completely normal, you will receive your results only by: [place] (if you have MyChart) OR A paper copy in the mail If you have any lab test that is abnormal or we need to change your treatment, we will call you to review the results.   Testing/Procedures:  IN OCTOBER 2024. Non-Cardiac CT Angiography (CTA), is a special type of CT scan that uses a computer to produce multi-dimensional views of major blood vessels throughout the body. In CT angiography, a contrast material is injected through an IV to help visualize the blood vessels    Follow-Up: At [facility], you and your health needs are our priority.  As part of our continuing mission to provide you with exceptional heart care, we have created designated Provider Care Teams.  These Care Teams include your primary Cardiologist (physician) and Advanced Practice Providers (APPs -  Physician Assistants and Nurse Practitioners) who all work together to provide you with the care you need, when you need it.  We recommend signing up for the patient portal called "MyChart".  Sign up information is provided on this After Visit Summary.  MyChart is used to connect with patients for Virtual Visits (Telemedicine).  Patients are able to view lab/test results, encounter notes, upcoming appointments, etc.  Non-urgent messages can be sent to your provider as well.   To learn more about what you can do with MyChart, go to [email or web address].    Your next appointment:   1 year(s)  The format for your next appointment:   In Person  Provider:   Larae Grooms, MD     Other  Instructions  PLEASE CONTACT OFFICE IF [place] About Sugar      Information About Your Aneurysm  One of your tests has shown an aneurysm of your aorta. The word "aneurysm" refers to a bulge in an artery (blood vessel). Most people think of them in the context of an emergency, but yours was found incidentally. At this point there is nothing you need to do from a procedure standpoint, but there are some important things to keep in mind for day-to-day life.  Mainstays of therapy for aneurysms include very good blood pressure control, healthy lifestyle, and avoiding tobacco products and street drugs. Research has raised concern that antibiotics in the fluoroquinolone class could be associated with increased risk of having an aneurysm develop or tear. This includes medicines that end in "floxacin," like Cipro or Levaquin. Make sure to discuss this information with other healthcare providers if you require antibiotics.  Since aneurysms can run in families, you should discuss your diagnosis with first degree relatives as they may need to be screened for this. Regular mild-moderate physical exercise is important, but avoid heavy lifting/weight lifting over 30lbs, chopping wood, shoveling snow or digging heavy earth with a shovel. It is best to avoid activities that cause grunting or straining (medically referred to as a "Valsalva maneuver"). This happens when a person bears down against a closed throat to increase the strength of arm or abdominal  muscles. There's often a tendency to do this when lifting heavy weights, doing sit-ups, push-ups or chin-ups, etc., but it may be harmful.  This is a finding I would expect to be monitored periodically by your cardiology team. Most unruptured thoracic aortic aneurysms cause no symptoms, so they are often found during exams for other conditions. Contact a health care provider if you develop any discomfort  in your upper back, neck, abdomen, trouble swallowing, cough or hoarseness, or unexplained weight loss. Get help right away if you develop severe pain in your upper back or abdomen that may move into your chest and arms, or any other concerning symptoms such as shortness of breath or fever.

## 2022-08-01 NOTE — Telephone Encounter (Signed)
Lm on vm again regarding holding Eliquis for 2 days. Requested a call back or mychart message.

## 2022-08-04 NOTE — Telephone Encounter (Signed)
Patient has not called back or sent mychart message

## 2022-08-05 ENCOUNTER — Ambulatory Visit (AMBULATORY_SURGERY_CENTER): Payer: Medicare Other | Admitting: Internal Medicine

## 2022-08-05 ENCOUNTER — Encounter: Payer: Self-pay | Admitting: Internal Medicine

## 2022-08-05 VITALS — BP 107/71 | HR 56 | Temp 98.4°F | Resp 15 | Ht 72.0 in | Wt 198.0 lb

## 2022-08-05 DIAGNOSIS — Z86010 Personal history of colonic polyps: Secondary | ICD-10-CM

## 2022-08-05 DIAGNOSIS — Z09 Encounter for follow-up examination after completed treatment for conditions other than malignant neoplasm: Secondary | ICD-10-CM | POA: Diagnosis not present

## 2022-08-05 MED ORDER — SODIUM CHLORIDE 0.9 % IV SOLN: 500.0000 mL | Freq: Once | INTRAVENOUS | Status: DC

## 2022-08-05 NOTE — Op Note (Addendum)
[facility] Patient Name: Chris Moore Procedure Date: 08/05/2022 1:49 PM MRN: [id number] Endoscopist: Docia Chuck. Henrene Pastor , MD, [id number] Age: 76 Referring MD:  Date of Birth: 03-07-1946 Gender: Male Account #: [id number] Procedure:                Colonoscopy Indications:              High risk colon cancer surveillance: Personal                            history of non-advanced adenoma. Previous                            examinations 2002, 2008, 2018 Medicines:                Monitored Anesthesia Care Procedure:                Pre-Anesthesia Assessment:                           - Prior to the procedure, a History and Physical                            was performed, and patient medications and                            allergies were reviewed. The patient's tolerance of                            previous anesthesia was also reviewed. The risks                            and benefits of the procedure and the sedation                            options and risks were discussed with the patient.                            All questions were answered, and informed consent                            was obtained. Prior Anticoagulants: The patient has                            taken Eliquis (apixaban), last dose was 3 days                            prior to procedure. ASA Grade Assessment: III - A                            patient with severe systemic disease. After                            reviewing the risks and benefits, the patient was  deemed in satisfactory condition to undergo the                            procedure.                           After obtaining informed consent, the colonoscope                            was passed under direct vision. Throughout the                            procedure, the patient's blood pressure, pulse, and                            oxygen saturations were monitored continuously. The                             Olympus CF-HQ190L (Serial# [id number]) Colonoscope was                            introduced through the anus and advanced to the the                            cecum, identified by appendiceal orifice and                            ileocecal valve. The ileocecal valve, appendiceal                            orifice, and rectum were photographed. The quality                            of the bowel preparation was good. The colonoscopy                            was performed without difficulty. The patient                            tolerated the procedure well. The bowel preparation                            used was SUPREP via split dose instruction. Scope In: 1:55:14 PM Scope Out: 2:08:46 PM Scope Withdrawal Time: 0 hours 7 minutes 35 seconds  Total Procedure Duration: 0 hours 13 minutes 32 seconds  Findings:                 Many large-mouthed diverticula were found in the                            entire colon.                           Internal hemorrhoids were found during retroflexion.  The exam was otherwise without abnormality on                            direct and retroflexion views. Complications:            No immediate complications. Estimated blood loss:                            None. Estimated Blood Loss:     Estimated blood loss: none. Impression:               - Diverticulosis in the entire examined colon.                           - Internal hemorrhoids.                           - The examination was otherwise normal on direct                            and retroflexion views.                           - No specimens collected. Recommendation:           - Repeat colonoscopy is not recommended for                            surveillance.                           - Patient has a contact number available for                            emergencies. The signs and symptoms of potential                            delayed complications  were discussed with the                            patient. Return to normal activities tomorrow.                            Written discharge instructions were provided to the                            patient.                           - Resume previous diet.                           - Continue present medications.                           -[place]. Henrene Pastor, MD 08/05/2022 2:14:21 PM This report has been signed electronically.

## 2022-08-05 NOTE — Patient Instructions (Signed)
Handout on diverticulosis given to you today   YOU HAD AN ENDOSCOPIC PROCEDURE TODAY AT [facility]:   Refer to the procedure report that was given to you for any specific questions about what was found during the examination.  If the procedure report does not answer your questions, please call your gastroenterologist to clarify.  If you requested that your care partner not be given the details of your procedure findings, then the procedure report has been included in a sealed envelope for you to review at your convenience later.  YOU SHOULD EXPECT: Some feelings of bloating in the abdomen. Passage of more gas than usual.  Walking can help get rid of the air that was put into your GI tract during the procedure and reduce the bloating. If you had a lower endoscopy (such as a colonoscopy or flexible sigmoidoscopy) you may notice spotting of blood in your stool or on the toilet paper. If you underwent a bowel prep for your procedure, you may not have a normal bowel movement for a few days.  Please Note:  You might notice some irritation and congestion in your nose or some drainage.  This is from the oxygen used during your procedure.  There is no need for concern and it should clear up in a day or so.  SYMPTOMS TO REPORT IMMEDIATELY:  Following lower endoscopy (colonoscopy or flexible sigmoidoscopy):  Excessive amounts of blood in the stool  Significant tenderness or worsening of abdominal pains  Swelling of the abdomen that is new, acute  Fever of 100F or higher  For urgent or emergent issues, a gastroenterologist can be reached at any hour by calling [phone]. Do not use MyChart messaging for urgent concerns.    DIET:  We do recommend a small meal at first, but then you may proceed to your regular diet.  Drink plenty of fluids but you should avoid alcoholic beverages for 24 hours.  ACTIVITY:  You should plan to take it easy for the rest of today and you should NOT DRIVE  or use heavy machinery until tomorrow (because of the sedation medicines used during the test).    FOLLOW UP: Our staff will call the number listed on your records the next business day following your procedure.  We will call around 7:15- 8:00 am to check on you and address any questions or concerns that you may have regarding the information given to you following your procedure. If we do not reach you, we will leave a message.      SIGNATURES/CONFIDENTIALITY: You and/or your care partner have signed paperwork which will be entered into your electronic medical record.  These signatures attest to the fact that that the information above on your After Visit Summary has been reviewed and is understood.  Full responsibility of the confidentiality of this discharge information lies with you and/or your care-partner.

## 2022-08-05 NOTE — Progress Notes (Signed)
Report to PACU, RN, vss, BBS= Clear.  

## 2022-08-05 NOTE — Progress Notes (Signed)
HISTORY OF PRESENT ILLNESS:   Chris Moore is a 76 y.o. male with past medical history as listed below who presents today regarding surveillance colonoscopy due to history of adenomatous colon polyps.  Previous colonoscopies 2002, 2008, 2018.   The patient has a history of unprovoked DVT with pulmonary embolism and negative hypercoagulability work-up for which she has been on Eliquis for 1 year.  Last seen by his hematologist March 2023.  Has upcoming appointment with his cardiologist.  Oncology had recommended staying on Eliquis long-term.  Patient presents to the office today to address his anticoagulation preprocedure.   The patient's GI review of systems is negative except for occasional issues with hemorrhoids.  Repeat blood work from December 04, 2021 shows unremarkable comprehensive metabolic panel except for creatinine 1.56.  Unremarkable CBC with hemoglobin 14.2.   REVIEW OF SYSTEMS:   All non-GI ROS negative unless otherwise stated in the HPI except for arthritis       Past Medical History:  Diagnosis Date   Allergy     Arthritis     Hypercholesteremia     Hypertension     Internal hemorrhoids     Prediabetes             Past Surgical History:  Procedure Laterality Date   COLONOSCOPY       HERNIA REPAIR       left knee arthroscopic surgery       POLYPECTOMY          Social History Chris Moore  reports that he has quit smoking. His smoking use included cigarettes. He has never used smokeless tobacco. He reports current alcohol use. He reports that he does not use drugs.   family history includes Diabetes in his mother; Hyperlipidemia in his mother; Hypertension in his father and mother.   No Known Allergies       PHYSICAL EXAMINATION: Vital signs: BP 130/86   Pulse 70   Ht 6' (1.829 m)   Wt 198 lb 4 oz (89.9 kg)   BMI 26.89 kg/m   Constitutional: generally well-appearing, no acute distress Psychiatric: alert and oriented x3, cooperative Eyes:  extraocular movements intact, anicteric, conjunctiva pink Mouth: oral pharynx moist, no lesions Neck: supple no lymphadenopathy Cardiovascular: heart regular rate and rhythm, no murmur Lungs: clear to auscultation bilaterally Abdomen: soft, nontender, nondistended, no obvious ascites, no peritoneal signs, normal bowel sounds, no organomegaly Rectal: Deferred until colonoscopy Extremities: no clubbing, cyanosis, or lower extremity edema bilaterally Skin: no lesions on visible extremities Neuro: No focal deficits.  Cranial nerves intact   ASSESSMENT:   1.  History of adenomatous colon polyps.  Presents today for surveillance colonoscopy.  Appropriate candidate without contraindication 2.  History of unprovoked DVT with PE (after hernia repair) and subsequent negative hypercoagulability work-up.  Has been on Eliquis for 1 year     PLAN:   1.  Colonoscopy.  Patient is high risk given the need to address anticoagulation.The nature of the procedure, as well as the risks, benefits, and alternatives were carefully and thoroughly reviewed with the patient. Ample time for discussion and questions allowed. The patient understood, was satisfied, and agreed to proceed.  2.  Hold Eliquis the day prior to the procedure and the day of the procedure.  Would anticipate resumption of anticoagulation immediately post procedure.  I will check with his hematologist to see if this is acceptable

## 2022-08-05 NOTE — Progress Notes (Signed)
Pt's states no medical or surgical changes since previsit or office visit. 

## 2022-08-06 ENCOUNTER — Telehealth: Payer: Self-pay | Admitting: *Deleted

## 2022-08-06 NOTE — Telephone Encounter (Signed)
Attempted f/u phone call. No answer. Left message. °

## 2022-10-01 ENCOUNTER — Ambulatory Visit: Payer: Medicare Other | Attending: Student

## 2022-10-01 DIAGNOSIS — E782 Mixed hyperlipidemia: Secondary | ICD-10-CM

## 2022-10-01 LAB — HEPATIC FUNCTION PANEL
ALT: 30 IU/L (ref 0–44)
AST: 31 IU/L (ref 0–40)
Albumin: 3.8 g/dL (ref 3.8–4.8)
Alkaline Phosphatase: 72 IU/L (ref 44–121)
Bilirubin Total: 0.4 mg/dL (ref 0.0–1.2)
Bilirubin, Direct: 0.11 mg/dL (ref 0.00–0.40)
Total Protein: 6.2 g/dL (ref 6.0–8.5)

## 2022-10-01 LAB — LIPID PANEL
Chol/HDL Ratio: 3.2 ratio (ref 0.0–5.0)
Cholesterol, Total: 167 mg/dL (ref 100–199)
HDL: 53 mg/dL (ref >39)
LDL Chol Calc (NIH): 97 mg/dL (ref 0–99)
Triglycerides: 91 mg/dL (ref 0–149)
VLDL Cholesterol Cal: 17 mg/dL (ref 5–40)

## 2022-11-25 DIAGNOSIS — N1831 Chronic kidney disease, stage 3a: Secondary | ICD-10-CM | POA: Diagnosis not present

## 2022-11-25 DIAGNOSIS — D696 Thrombocytopenia, unspecified: Secondary | ICD-10-CM | POA: Diagnosis not present

## 2022-11-25 DIAGNOSIS — E1169 Type 2 diabetes mellitus with other specified complication: Secondary | ICD-10-CM | POA: Diagnosis not present

## 2022-11-25 DIAGNOSIS — E1122 Type 2 diabetes mellitus with diabetic chronic kidney disease: Secondary | ICD-10-CM | POA: Diagnosis not present

## 2022-11-25 DIAGNOSIS — Z0000 Encounter for general adult medical examination without abnormal findings: Secondary | ICD-10-CM | POA: Diagnosis not present

## 2022-11-25 DIAGNOSIS — Z23 Encounter for immunization: Secondary | ICD-10-CM | POA: Diagnosis not present

## 2022-11-25 DIAGNOSIS — E782 Mixed hyperlipidemia: Secondary | ICD-10-CM | POA: Diagnosis not present

## 2022-11-25 DIAGNOSIS — J449 Chronic obstructive pulmonary disease, unspecified: Secondary | ICD-10-CM | POA: Diagnosis not present

## 2022-11-25 DIAGNOSIS — I10 Essential (primary) hypertension: Secondary | ICD-10-CM | POA: Diagnosis not present

## 2022-11-25 DIAGNOSIS — I7121 Aneurysm of the ascending aorta, without rupture: Secondary | ICD-10-CM | POA: Diagnosis not present

## 2022-11-27 ENCOUNTER — Other Ambulatory Visit: Payer: Self-pay | Admitting: Family Medicine

## 2022-11-27 DIAGNOSIS — I7121 Aneurysm of the ascending aorta, without rupture: Secondary | ICD-10-CM

## 2023-03-24 DIAGNOSIS — H52223 Regular astigmatism, bilateral: Secondary | ICD-10-CM | POA: Diagnosis not present

## 2023-03-24 DIAGNOSIS — H53143 Visual discomfort, bilateral: Secondary | ICD-10-CM | POA: Diagnosis not present

## 2023-03-24 DIAGNOSIS — E119 Type 2 diabetes mellitus without complications: Secondary | ICD-10-CM | POA: Diagnosis not present

## 2023-03-24 DIAGNOSIS — H524 Presbyopia: Secondary | ICD-10-CM | POA: Diagnosis not present

## 2023-05-29 DIAGNOSIS — I10 Essential (primary) hypertension: Secondary | ICD-10-CM | POA: Diagnosis not present

## 2023-05-29 DIAGNOSIS — I700 Atherosclerosis of aorta: Secondary | ICD-10-CM | POA: Diagnosis not present

## 2023-05-29 DIAGNOSIS — E782 Mixed hyperlipidemia: Secondary | ICD-10-CM | POA: Diagnosis not present

## 2023-05-29 DIAGNOSIS — I2699 Other pulmonary embolism without acute cor pulmonale: Secondary | ICD-10-CM | POA: Diagnosis not present

## 2023-05-29 DIAGNOSIS — N1831 Chronic kidney disease, stage 3a: Secondary | ICD-10-CM | POA: Diagnosis not present

## 2023-05-29 DIAGNOSIS — J309 Allergic rhinitis, unspecified: Secondary | ICD-10-CM | POA: Diagnosis not present

## 2023-05-29 DIAGNOSIS — D696 Thrombocytopenia, unspecified: Secondary | ICD-10-CM | POA: Diagnosis not present

## 2023-05-29 DIAGNOSIS — Z23 Encounter for immunization: Secondary | ICD-10-CM | POA: Diagnosis not present

## 2023-05-29 DIAGNOSIS — J449 Chronic obstructive pulmonary disease, unspecified: Secondary | ICD-10-CM | POA: Diagnosis not present

## 2023-05-29 DIAGNOSIS — E1169 Type 2 diabetes mellitus with other specified complication: Secondary | ICD-10-CM | POA: Diagnosis not present

## 2023-05-29 DIAGNOSIS — I7121 Aneurysm of the ascending aorta, without rupture: Secondary | ICD-10-CM | POA: Diagnosis not present

## 2023-05-29 DIAGNOSIS — E1122 Type 2 diabetes mellitus with diabetic chronic kidney disease: Secondary | ICD-10-CM | POA: Diagnosis not present

## 2023-06-09 DIAGNOSIS — N1831 Chronic kidney disease, stage 3a: Secondary | ICD-10-CM | POA: Diagnosis not present

## 2023-07-02 ENCOUNTER — Telehealth: Payer: Self-pay | Admitting: Student

## 2023-07-02 NOTE — Telephone Encounter (Signed)
New Message:     Patient would like to know his test was cancelled for tomorrow (07-03-23) please? Patient said to please leave him a message, if he is not there.

## 2023-07-02 NOTE — Telephone Encounter (Signed)
Called pt to inform him that his appt was cancelled due to the machine being down. Advised pt they are call to reschedule.

## 2023-07-03 ENCOUNTER — Ambulatory Visit (HOSPITAL_BASED_OUTPATIENT_CLINIC_OR_DEPARTMENT_OTHER): Payer: Medicare Other

## 2023-07-08 ENCOUNTER — Encounter: Payer: Self-pay | Admitting: Interventional Cardiology

## 2023-07-29 ENCOUNTER — Ambulatory Visit (HOSPITAL_COMMUNITY)
Admission: RE | Admit: 2023-07-29 | Discharge: 2023-07-29 | Disposition: A | Discharge status: Home / Self Care | Payer: Medicare Other | Source: Ambulatory Visit | Attending: Student | Admitting: Student

## 2023-07-29 DIAGNOSIS — N183 Chronic kidney disease, stage 3 unspecified: Secondary | ICD-10-CM | POA: Insufficient documentation

## 2023-07-29 DIAGNOSIS — I517 Cardiomegaly: Secondary | ICD-10-CM | POA: Diagnosis not present

## 2023-07-29 DIAGNOSIS — I700 Atherosclerosis of aorta: Secondary | ICD-10-CM | POA: Diagnosis not present

## 2023-07-29 DIAGNOSIS — Z8679 Personal history of other diseases of the circulatory system: Secondary | ICD-10-CM | POA: Insufficient documentation

## 2023-07-29 DIAGNOSIS — I719 Aortic aneurysm of unspecified site, without rupture: Secondary | ICD-10-CM | POA: Diagnosis not present

## 2023-07-29 MED ORDER — GADOBUTROL 1 MMOL/ML IV SOLN
9.0000 mL | Freq: Once | INTRAVENOUS | Status: AC | PRN
  Administered 2023-07-29: 9 mL via INTRAVENOUS

## 2023-08-18 NOTE — Progress Notes (Unsigned)
No chief complaint on file.  History of Present Illness: 77 yo male with history of CAD, HTN, HLD, PACs, PVCs, thoracic aortic aneurysm, CKD stage III and prior PE who is here today for follow up. He has been followed in our office by Dr. Eldridge Dace. Coronary CTA in October 2022 with minimal LAD plaque, calcium score of 31. Mild dilation of the ascending aorta at 4.1 cm in October 2023. He was found to have bilateral pulmonary emboli in 2022 and has remained in Eliquis. He has had palpitations in the past felt to be due to PACs and PVCs.   He tells me today that he ***  Primary Care Physician: Tally Joe, MD   Past Medical History:  Diagnosis Date   Allergy    Arthritis    Hypercholesteremia    Hypertension    Internal hemorrhoids    Prediabetes     Past Surgical History:  Procedure Laterality Date   COLONOSCOPY     HERNIA REPAIR     left knee arthroscopic surgery     POLYPECTOMY      Current Outpatient Medications  Medication Sig Dispense Refill   acetaminophen (TYLENOL) 500 MG tablet Take 1,000 mg by mouth every 6 (six) hours as needed for mild pain, moderate pain, fever or headache.     apixaban (ELIQUIS) 5 MG TABS tablet Take 1 tablet (5 mg total) by mouth 2 (two) times daily. 60 tablet 5   atorvastatin (LIPITOR) 80 MG tablet Take 1 tablet (80 mg total) by mouth at bedtime. 90 tablet 1   benzonatate (TESSALON) 100 MG capsule Take 1 capsule (100 mg total) by mouth at bedtime as needed for cough. 15 capsule 0   diphenhydrAMINE (BENADRYL) 25 MG tablet Take 25 mg by mouth at bedtime as needed.     fexofenadine (ALLEGRA) 180 MG tablet Take 180 mg by mouth daily.     fluticasone (FLONASE) 50 MCG/ACT nasal spray Place 2 sprays into both nostrils daily. 16 g 0   hydrochlorothiazide (HYDRODIURIL) 12.5 MG tablet Take 12.5 mg by mouth daily.     losartan (COZAAR) 100 MG tablet Take 100 mg by mouth daily.     Current Facility-Administered Medications  Medication Dose Route  Frequency Provider Last Rate Last Admin   0.9 %  sodium chloride infusion  500 mL Intravenous Continuous Hilarie Fredrickson, MD        No Known Allergies  Social History   Socioeconomic History   Marital status: Married    Spouse name: Not on file   Number of children: 3   Years of education: Not on file   Highest education level: Not on file  Occupational History   Not on file  Tobacco Use   Smoking status: Former    Types: Cigarettes   Smokeless tobacco: Never   Tobacco comments:    quit at age 64  Vaping Use   Vaping status: Never Used  Substance and Sexual Activity   Alcohol use: Yes    Alcohol/week: 0.0 standard drinks of alcohol    Comment: socially   Drug use: No   Sexual activity: Not on file  Other Topics Concern   Not on file  Social History Narrative   Not on file   Social Determinants of Health   Financial Resource Strain: Not on file  Food Insecurity: Not on file  Transportation Needs: Not on file  Physical Activity: Not on file  Stress: Not on file  Social Connections: Not  on file  Intimate Partner Violence: Not on file    Family History  Problem Relation Age of Onset   Hypertension Mother    Diabetes Mother    Hyperlipidemia Mother    Hypertension Father    Colon polyps Neg Hx    Colon cancer Neg Hx    Esophageal cancer Neg Hx    Rectal cancer Neg Hx    Stomach cancer Neg Hx     Review of Systems:  As stated in the HPI and otherwise negative.   There were no vitals taken for this visit.  Physical Examination: General: Well developed, well nourished, NAD  HEENT: OP clear, mucus membranes moist  SKIN: warm, dry. No rashes. Neuro: No focal deficits  Musculoskeletal: Muscle strength 5/5 all ext  Psychiatric: Mood and affect normal  Neck: No JVD, no carotid bruits, no thyromegaly, no lymphadenopathy.  Lungs:Clear bilaterally, no wheezes, rhonci, crackles Cardiovascular: Regular rate and rhythm. No murmurs, gallops or rubs. Abdomen:Soft.  Bowel sounds present. Non-tender.  Extremities: No lower extremity edema. Pulses are 2 + in the bilateral DP/PT.  EKG:  EKG {ACTION; IS/IS [id number]} ordered today. The ekg ordered today demonstrates ***  Recent Labs: 10/01/2022: ALT 30   Lipid Panel    Component Value Date/Time   CHOL 167 10/01/2022 0942   TRIG 91 10/01/2022 0942   HDL 53 10/01/2022 0942   CHOLHDL 3.2 10/01/2022 0942   LDLCALC 97 10/01/2022 0942     Wt Readings from Last 3 Encounters:  08/05/22 89.8 kg  07/29/22 92 kg  06/27/22 89.9 kg      Assessment and Plan:   1. CAD without angina: No chest pain suggestive of angina. He has mild CAD noted on coronary CTA in 2022. He is on Eliquis and not on ASA. Continue statin    2. HTN: BP is well controlled. No changes today  3. HLD: Goal LDL under 70. Last LDL ***. Continue statin.   4. PVCs/PACs: No recent palpitations  5. Thoracic aortic aneurysm: 4.1 cm by chest CTA October 2023. *** Repeat CTA chest now.   Labs/ tests ordered today include:  No orders of the defined types were placed in this encounter.  Disposition:   F/U with me in ***    Signed, Verne Carrow, MD, [facility] 08/18/2023 12:47 PM    [facility] [address] [place], [place], [place]  [address] Phone: [phone]; Fax: [phone]

## 2023-08-19 ENCOUNTER — Encounter: Payer: Self-pay | Admitting: Cardiovascular Disease

## 2023-08-19 ENCOUNTER — Ambulatory Visit: Payer: Medicare Other | Attending: Cardiovascular Disease | Admitting: Cardiovascular Disease

## 2023-08-19 VITALS — BP 156/92 | HR 76 | Ht 72.0 in | Wt 202.6 lb

## 2023-08-19 DIAGNOSIS — I2510 Atherosclerotic heart disease of native coronary artery without angina pectoris: Secondary | ICD-10-CM | POA: Diagnosis not present

## 2023-08-19 DIAGNOSIS — I10 Essential (primary) hypertension: Secondary | ICD-10-CM | POA: Diagnosis not present

## 2023-08-19 DIAGNOSIS — I77810 Thoracic aortic ectasia: Secondary | ICD-10-CM

## 2023-08-19 DIAGNOSIS — E782 Mixed hyperlipidemia: Secondary | ICD-10-CM | POA: Diagnosis not present

## 2023-08-19 MED ORDER — ROSUVASTATIN CALCIUM 40 MG PO TABS: 40.0000 mg | ORAL_TABLET | Freq: Every day | ORAL | 3 refills | Status: AC

## 2023-08-19 NOTE — Patient Instructions (Addendum)
Medication Instructions:  Your physician has recommended you make the following change in your medication:  1.) stop atorvastatin 2.) start rosuvastatin (Crestor) 40 mg - one tablet daily  *If you need a refill on your cardiac medications before your next appointment, please call your pharmacy*   Lab Work: Please return in 12 weeks for blood work (bmet, cbc)   Testing/Procedures: none   Follow-Up: At [facility], you and your health needs are our priority.  As part of our continuing mission to provide you with exceptional heart care, we have created designated Provider Care Teams.  These Care Teams include your primary Cardiologist (physician) and Advanced Practice Providers (APPs -  Physician Assistants and Nurse Practitioners) who all work together to provide you with the care you need, when you need it.    Your next appointment:   12 month(s)  Provider:   Verne Carrow, MD

## 2023-11-20 DIAGNOSIS — I2510 Atherosclerotic heart disease of native coronary artery without angina pectoris: Secondary | ICD-10-CM | POA: Diagnosis not present

## 2023-11-21 LAB — LIPID PANEL
Chol/HDL Ratio: 2.9 ratio (ref 0.0–5.0)
Cholesterol, Total: 158 mg/dL (ref 100–199)
HDL: 55 mg/dL (ref >39)
LDL Chol Calc (NIH): 87 mg/dL (ref 0–99)
Triglycerides: 88 mg/dL (ref 0–149)
VLDL Cholesterol Cal: 16 mg/dL (ref 5–40)

## 2023-11-21 LAB — HEPATIC FUNCTION PANEL
ALT: 38 IU/L (ref 0–44)
AST: 39 IU/L (ref 0–40)
Albumin: 3.9 g/dL (ref 3.8–4.8)
Alkaline Phosphatase: 70 IU/L (ref 44–121)
Bilirubin Total: 0.4 mg/dL (ref 0.0–1.2)
Bilirubin, Direct: 0.16 mg/dL (ref 0.00–0.40)
Total Protein: 6.3 g/dL (ref 6.0–8.5)

## 2024-01-05 DIAGNOSIS — M25552 Pain in left hip: Secondary | ICD-10-CM | POA: Diagnosis not present

## 2024-01-05 DIAGNOSIS — Z0000 Encounter for general adult medical examination without abnormal findings: Secondary | ICD-10-CM | POA: Diagnosis not present

## 2024-01-05 DIAGNOSIS — J449 Chronic obstructive pulmonary disease, unspecified: Secondary | ICD-10-CM | POA: Diagnosis not present

## 2024-01-05 DIAGNOSIS — E782 Mixed hyperlipidemia: Secondary | ICD-10-CM | POA: Diagnosis not present

## 2024-01-05 DIAGNOSIS — E1169 Type 2 diabetes mellitus with other specified complication: Secondary | ICD-10-CM | POA: Diagnosis not present

## 2024-01-05 DIAGNOSIS — I7121 Aneurysm of the ascending aorta, without rupture: Secondary | ICD-10-CM | POA: Diagnosis not present

## 2024-01-05 DIAGNOSIS — I10 Essential (primary) hypertension: Secondary | ICD-10-CM | POA: Diagnosis not present

## 2024-01-05 DIAGNOSIS — I2699 Other pulmonary embolism without acute cor pulmonale: Secondary | ICD-10-CM | POA: Diagnosis not present

## 2024-01-05 DIAGNOSIS — N1831 Chronic kidney disease, stage 3a: Secondary | ICD-10-CM | POA: Diagnosis not present

## 2024-01-13 ENCOUNTER — Emergency Department (HOSPITAL_BASED_OUTPATIENT_CLINIC_OR_DEPARTMENT_OTHER)

## 2024-01-13 ENCOUNTER — Emergency Department (HOSPITAL_BASED_OUTPATIENT_CLINIC_OR_DEPARTMENT_OTHER): Admitting: Radiology

## 2024-01-13 ENCOUNTER — Other Ambulatory Visit: Payer: Self-pay

## 2024-01-13 ENCOUNTER — Emergency Department (HOSPITAL_BASED_OUTPATIENT_CLINIC_OR_DEPARTMENT_OTHER)
Admission: EM | Admit: 2024-01-13 | Discharge: 2024-01-13 | Disposition: A | Discharge status: Home / Self Care | Attending: Emergency Medicine | Admitting: Emergency Medicine

## 2024-01-13 DIAGNOSIS — Y92410 Unspecified street and highway as the place of occurrence of the external cause: Secondary | ICD-10-CM | POA: Insufficient documentation

## 2024-01-13 DIAGNOSIS — R519 Headache, unspecified: Secondary | ICD-10-CM | POA: Insufficient documentation

## 2024-01-13 DIAGNOSIS — S199XXA Unspecified injury of neck, initial encounter: Secondary | ICD-10-CM | POA: Diagnosis not present

## 2024-01-13 DIAGNOSIS — R079 Chest pain, unspecified: Secondary | ICD-10-CM | POA: Insufficient documentation

## 2024-01-13 DIAGNOSIS — Z79899 Other long term (current) drug therapy: Secondary | ICD-10-CM | POA: Diagnosis not present

## 2024-01-13 DIAGNOSIS — I700 Atherosclerosis of aorta: Secondary | ICD-10-CM | POA: Diagnosis not present

## 2024-01-13 DIAGNOSIS — R0789 Other chest pain: Secondary | ICD-10-CM | POA: Insufficient documentation

## 2024-01-13 DIAGNOSIS — I10 Essential (primary) hypertension: Secondary | ICD-10-CM | POA: Insufficient documentation

## 2024-01-13 DIAGNOSIS — M542 Cervicalgia: Secondary | ICD-10-CM | POA: Diagnosis not present

## 2024-01-13 DIAGNOSIS — Z7901 Long term (current) use of anticoagulants: Secondary | ICD-10-CM | POA: Diagnosis not present

## 2024-01-13 DIAGNOSIS — S0990XA Unspecified injury of head, initial encounter: Secondary | ICD-10-CM | POA: Diagnosis not present

## 2024-01-13 LAB — URINALYSIS, ROUTINE W REFLEX MICROSCOPIC
Bacteria, UA: NONE SEEN
Bilirubin Urine: NEGATIVE
Glucose, UA: NEGATIVE mg/dL
Hgb urine dipstick: NEGATIVE
Leukocytes,Ua: NEGATIVE
Nitrite: NEGATIVE
Protein, ur: 30 mg/dL — AB
Specific Gravity, Urine: 1.025 (ref 1.005–1.030)
pH: 6 (ref 5.0–8.0)
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
## 2024-07-07 ENCOUNTER — Telehealth: Payer: Self-pay | Admitting: Cardiovascular Disease

## 2024-07-07 DIAGNOSIS — I10 Essential (primary) hypertension: Secondary | ICD-10-CM | POA: Diagnosis not present

## 2024-07-07 DIAGNOSIS — I7121 Aneurysm of the ascending aorta, without rupture: Secondary | ICD-10-CM | POA: Diagnosis not present

## 2024-07-07 DIAGNOSIS — I77810 Thoracic aortic ectasia: Secondary | ICD-10-CM

## 2024-07-07 DIAGNOSIS — D696 Thrombocytopenia, unspecified: Secondary | ICD-10-CM | POA: Diagnosis not present

## 2024-07-07 DIAGNOSIS — Z23 Encounter for immunization: Secondary | ICD-10-CM | POA: Diagnosis not present

## 2024-07-07 DIAGNOSIS — I2699 Other pulmonary embolism without acute cor pulmonale: Secondary | ICD-10-CM | POA: Diagnosis not present

## 2024-07-07 DIAGNOSIS — J309 Allergic rhinitis, unspecified: Secondary | ICD-10-CM | POA: Diagnosis not present

## 2024-07-07 DIAGNOSIS — E782 Mixed hyperlipidemia: Secondary | ICD-10-CM | POA: Diagnosis not present

## 2024-07-07 DIAGNOSIS — E1169 Type 2 diabetes mellitus with other specified complication: Secondary | ICD-10-CM | POA: Diagnosis not present

## 2024-07-07 DIAGNOSIS — N1831 Chronic kidney disease, stage 3a: Secondary | ICD-10-CM | POA: Diagnosis not present

## 2024-07-07 DIAGNOSIS — J449 Chronic obstructive pulmonary disease, unspecified: Secondary | ICD-10-CM | POA: Diagnosis not present

## 2024-07-07 NOTE — Telephone Encounter (Signed)
 Pt called in to sch yearly MRI, please advise if this can be ordered.

## 2024-07-07 NOTE — Telephone Encounter (Signed)
 Asking about yearly scan for Aortic aneurysm. Had MRI 08/08/23. Will forward to the provider.  Has appt 10/10/24 with Dr Verlin

## 2024-07-07 NOTE — Telephone Encounter (Signed)
 Left message and call back number. Also sent a mychart message

## 2024-07-12 NOTE — Telephone Encounter (Signed)
 Patient is following up requesting updates. Please advise.

## 2024-07-12 NOTE — Telephone Encounter (Signed)
 I spoke with patient and let him know MRA had been ordered and gave him phone number to schedule

## 2024-07-12 NOTE — Telephone Encounter (Signed)
 Order placed.  Left message for patient to call office

## 2024-07-29 ENCOUNTER — Ambulatory Visit (HOSPITAL_BASED_OUTPATIENT_CLINIC_OR_DEPARTMENT_OTHER)
Admission: RE | Admit: 2024-07-29 | Discharge: 2024-07-29 | Disposition: A | Discharge status: Home / Self Care | Source: Ambulatory Visit | Attending: Cardiovascular Disease | Admitting: Cardiovascular Disease

## 2024-07-29 DIAGNOSIS — I77810 Thoracic aortic ectasia: Secondary | ICD-10-CM | POA: Insufficient documentation

## 2024-07-29 MED ORDER — GADOBUTROL 1 MMOL/ML IV SOLN
9.0000 mL | Freq: Once | INTRAVENOUS | Status: AC | PRN
  Administered 2024-07-29: 9 mL via INTRAVENOUS
  Filled 2024-07-29: qty 10

## 2024-08-01 ENCOUNTER — Ambulatory Visit: Payer: Self-pay | Admitting: Cardiovascular Disease

## 2024-08-01 DIAGNOSIS — I517 Cardiomegaly: Secondary | ICD-10-CM

## 2024-08-04 NOTE — Telephone Encounter (Signed)
 Patient was returning call. Please advise ?

## 2024-09-13 ENCOUNTER — Ambulatory Visit (HOSPITAL_COMMUNITY)
Admission: RE | Admit: 2024-09-13 | Discharge: 2024-09-13 | Disposition: A | Discharge status: Home / Self Care | Source: Ambulatory Visit | Attending: Internal Medicine | Admitting: Internal Medicine

## 2024-09-13 DIAGNOSIS — I517 Cardiomegaly: Secondary | ICD-10-CM | POA: Insufficient documentation

## 2024-09-14 ENCOUNTER — Ambulatory Visit: Payer: Self-pay | Admitting: Cardiovascular Disease

## 2024-09-14 LAB — ECHOCARDIOGRAM COMPLETE
Area-P 1/2: 3.54 cm2
S' Lateral: 3.8 cm

## 2024-10-09 NOTE — Progress Notes (Unsigned)
 "   No chief complaint on file.  History of Present Illness: 79 yo male with history of CAD, HTN, HLD, PACs, PVCs, thoracic aortic aneurysm, CKD stage III and prior PE who is here today for follow up. He has been followed in our office by Dr. Dann. Coronary CTA in October 2022 with minimal LAD plaque, calcium  score of 31. Mild dilation of the ascending aorta at 4.3 cm in November 2024. He was found to have bilateral pulmonary emboli in 2022 and has remained in Eliquis . He has had palpitations in the past felt to be due to PACs and PVCs. Echo December 2025 with LVEF=50-55%. Mild to moderate MR.   He is here today for follow up. The patient denies any chest pain, dyspnea, palpitations, lower extremity edema, orthopnea, PND, dizziness, near syncope or syncope.   Primary Care Physician: Seabron Lenis, MD   Past Medical History:  Diagnosis Date   Allergy    Arthritis    Hypercholesteremia    Hypertension    Internal hemorrhoids    Prediabetes     Past Surgical History:  Procedure Laterality Date   COLONOSCOPY     HERNIA REPAIR     left knee arthroscopic surgery     POLYPECTOMY      Current Outpatient Medications  Medication Sig Dispense Refill   acetaminophen (TYLENOL) 500 MG tablet Take 1,000 mg by mouth every 6 (six) hours as needed for mild pain, moderate pain, fever or headache.     apixaban  (ELIQUIS ) 5 MG TABS tablet Take 1 tablet (5 mg total) by mouth 2 (two) times daily. 60 tablet 5   benzonatate  (TESSALON ) 100 MG capsule Take 1 capsule (100 mg total) by mouth at bedtime as needed for cough. 15 capsule 0   diphenhydrAMINE (BENADRYL) 25 MG tablet Take 25 mg by mouth at bedtime as needed.     fexofenadine  (ALLEGRA ) 180 MG tablet Take 180 mg by mouth daily.     fluticasone  (FLONASE ) 50 MCG/ACT nasal spray Place 2 sprays into both nostrils daily. 16 g 0   hydrochlorothiazide (HYDRODIURIL) 12.5 MG tablet Take 12.5 mg by mouth daily.     losartan (COZAAR) 100 MG tablet Take 100  mg by mouth daily.     rosuvastatin  (CRESTOR ) 40 MG tablet Take 1 tablet (40 mg total) by mouth daily. 90 tablet 3   Current Facility-Administered Medications  Medication Dose Route Frequency Provider Last Rate Last Admin   0.9 %  sodium chloride  infusion  500 mL Intravenous Continuous Abran Norleen SAILOR, MD        No Known Allergies  Social History   Socioeconomic History   Marital status: Married    Spouse name: Not on file   Number of children: 3   Years of education: Not on file   Highest education level: Not on file  Occupational History   Not on file  Tobacco Use   Smoking status: Former    Types: Cigarettes   Smokeless tobacco: Never   Tobacco comments:    quit at age 39  Vaping Use   Vaping status: Never Used  Substance and Sexual Activity   Alcohol use: Yes    Alcohol/week: 0.0 standard drinks of alcohol    Comment: socially   Drug use: No   Sexual activity: Not on file  Other Topics Concern   Not on file  Social History Narrative   Not on file   Social Drivers of Health   Tobacco Use: Medium Risk (08/19/2023)  Patient History    Smoking Tobacco Use: Former    Smokeless Tobacco Use: Never    Passive Exposure: Not on Actuary Strain: Not on file  Food Insecurity: Not on file  Transportation Needs: Not on file  Physical Activity: Not on file  Stress: Not on file  Social Connections: Not on file  Intimate Partner Violence: Not on file  Depression (PHQ2-9): Not on file  Alcohol Screen: Not on file  Housing: Not on file  Utilities: Not on file  Health Literacy: Not on file    Family History  Problem Relation Age of Onset   Hypertension Mother    Diabetes Mother    Hyperlipidemia Mother    Hypertension Father    Colon polyps Neg Hx    Colon cancer Neg Hx    Esophageal cancer Neg Hx    Rectal cancer Neg Hx    Stomach cancer Neg Hx     Review of Systems:  As stated in the HPI and otherwise negative.   There were no vitals taken  for this visit.  Physical Examination: General: Well developed, well nourished, NAD  SKIN: warm, dry. Neuro: No focal deficits  Psychiatric: Mood and affect normal  Neck: No JVD Lungs:Clear bilaterally, no wheezes, rhonci, crackles Cardiovascular: Regular rate and rhythm. No murmurs, gallops or rubs. Abdomen:Soft.  Extremities: No lower extremity edema.    EKG:  EKG is *** ordered today. The ekg ordered today demonstrates   Recent Labs: 11/20/2023: ALT 38   Lipid Panel    Component Value Date/Time   CHOL 158 11/20/2023 0829   TRIG 88 11/20/2023 0829   HDL 55 11/20/2023 0829   CHOLHDL 2.9 11/20/2023 0829   LDLCALC 87 11/20/2023 0829     Wt Readings from Last 3 Encounters:  07/29/24 200 lb (90.7 kg)  01/13/24 200 lb (90.7 kg)  08/19/23 202 lb 9.6 oz (91.9 kg)     Assessment and Plan:   1. CAD without angina: He has mild CAD noted on coronary CTA in 2022. No chest pain. He is on Eliquis  and not on ASA.  -Continue Crestor   2. HTN: BP is well controlled.  -Continue Losartan and HCTZ  3. HLD: LDL ***.  -Continue Crestor  (Lipitor was stopped at last visit). We discussed adding Zetia  but he has not done that   4. PVCs/PACs: No palpitations  5. Thoracic aortic aneurysm: 4.4 cm by chest MRA November 2025.  -Repeat chest MRA in November 2026.   6. Pulmonary embolism: He is on Eliquis  with plans to use this for lifetime.   Labs/ tests ordered today include:  No orders of the defined types were placed in this encounter.  Disposition:   F/U with me in one year    Signed, Lonni Cash, MD, [facility] 10/09/2024 4:49 PM    [facility] [address] [place], [place], [place]  [address] Phone: [phone]; Fax: [phone]    "

## 2024-10-10 ENCOUNTER — Ambulatory Visit: Attending: Cardiovascular Disease | Admitting: Cardiovascular Disease

## 2024-10-10 ENCOUNTER — Encounter: Payer: Self-pay | Admitting: Cardiovascular Disease

## 2024-10-10 VITALS — BP 142/60 | HR 76 | Ht 72.0 in | Wt 212.1 lb

## 2024-10-10 DIAGNOSIS — I2510 Atherosclerotic heart disease of native coronary artery without angina pectoris: Secondary | ICD-10-CM

## 2024-10-10 DIAGNOSIS — E782 Mixed hyperlipidemia: Secondary | ICD-10-CM

## 2024-10-10 DIAGNOSIS — I77810 Thoracic aortic ectasia: Secondary | ICD-10-CM | POA: Diagnosis not present

## 2024-10-10 DIAGNOSIS — I517 Cardiomegaly: Secondary | ICD-10-CM | POA: Diagnosis not present

## 2024-10-10 DIAGNOSIS — I340 Nonrheumatic mitral (valve) insufficiency: Secondary | ICD-10-CM | POA: Diagnosis not present

## 2024-10-10 DIAGNOSIS — I10 Essential (primary) hypertension: Secondary | ICD-10-CM

## 2024-10-10 MED ORDER — EZETIMIBE 10 MG PO TABS: 10.0000 mg | ORAL_TABLET | Freq: Every day | ORAL | 3 refills | Status: AC

## 2024-10-10 NOTE — Patient Instructions (Signed)
 Medication Instructions:  Start Zetia  (Ezetimibe ) 10 mg take one tablet daily *If you need a refill on your cardiac medications before your next appointment, please call your pharmacy*  Lab Work: None ordered If you have labs (blood work) drawn today and your tests are completely normal, you will receive your results only by: MyChart Message (if you have MyChart) OR A paper copy in the mail If you have any lab test that is abnormal or we need to change your treatment, we will call you to review the results.  Testing/Procedures:   You are scheduled for Cardiac MRA in November 2026 at the location below.    [facility] [address] [place], [place] [address] Please take advantage of the free valet parking available at the [facility] and [employer] (Entrance C).  Proceed to the [facility] Radiology Department (First Floor) for check-in.   Magnetic resonance imaging (MRI) is a painless test that produces images of the inside of the body without using Xrays.  During an MRI, strong magnets and radio waves work together in a data processing manager to form detailed images.   MRI images may provide more details about a medical condition than X-rays, CT scans, and ultrasounds can provide.  You may be given earphones to listen for instructions.  You may eat a light breakfast and take medications as ordered with the exception of furosemide, hydrochlorothiazide, chlorthalidone or spironolactone (or any other fluid pill). If you are undergoing a stress MRI, please avoid stimulants for 12 hr prior to test. (I.e. Caffeine, nicotine, chocolate, or antihistamine medications)  If your provider has ordered anti-anxiety medications for this test, then you will need a driver.  An IV will be inserted into one of your veins. Contrast material will be injected into your IV. It will leave your body through your urine within a day. You may be told to drink plenty of fluids to help flush the contrast  material out of your system.  You will be asked to remove all metal, including: Watch, jewelry, and other metal objects including hearing aids, hair pieces and dentures. Also wearable glucose monitoring systems (ie. Freestyle Libre and Omnipods) (Braces and fillings normally are not a problem.)   TEST WILL TAKE APPROXIMATELY 1 HOUR  PLEASE NOTIFY SCHEDULING AT LEAST 24 HOURS IN ADVANCE IF YOU ARE UNABLE TO KEEP YOUR APPOINTMENT. [phone]  For more information and frequently asked questions, please visit our website : [email or web address]  Please call the Cardiac Imaging Nurse Navigators with any questions/concerns. [phone] Office    Follow-Up: At [facility], you and your health needs are our priority.  As part of our continuing mission to provide you with exceptional heart care, our providers are all part of one team.  This team includes your primary Cardiologist (physician) and Advanced Practice Providers or APPs (Physician Assistants and Nurse Practitioners) who all work together to provide you with the care you need, when you need it.  Your next appointment:   1 year(s)  Provider:   Lonni Cash, MD

## 2025-08-02 ENCOUNTER — Other Ambulatory Visit (HOSPITAL_COMMUNITY)
